# Patient Record
Sex: Female | Born: 1975 | Race: White | Hispanic: No | Marital: Married | Smoking: Current every day smoker
Health system: Southern US, Community
[De-identification: ages and names within clinical notes are randomized; demographics above are authoritative.]

## PROBLEM LIST (undated history)

## (undated) DIAGNOSIS — G5603 Carpal tunnel syndrome, bilateral upper limbs: Secondary | ICD-10-CM

## (undated) DIAGNOSIS — K219 Gastro-esophageal reflux disease without esophagitis: Secondary | ICD-10-CM

## (undated) DIAGNOSIS — M1990 Unspecified osteoarthritis, unspecified site: Secondary | ICD-10-CM

## (undated) DIAGNOSIS — F419 Anxiety disorder, unspecified: Secondary | ICD-10-CM

## (undated) HISTORY — PX: MOUTH SURGERY: SHX715

## (undated) HISTORY — DX: Anxiety disorder, unspecified: F41.9

## (undated) HISTORY — DX: Gastro-esophageal reflux disease without esophagitis: K21.9

## (undated) HISTORY — DX: Unspecified osteoarthritis, unspecified site: M19.90

## (undated) HISTORY — DX: Carpal tunnel syndrome, bilateral upper limbs: G56.03

## (undated) HISTORY — PX: TUBAL LIGATION: SHX77

---

## 2010-06-13 ENCOUNTER — Emergency Department (HOSPITAL_COMMUNITY)
Admission: EM | Admit: 2010-06-13 | Discharge: 2010-06-13 | Disposition: A | Discharge status: Home / Self Care | Payer: Self-pay | Attending: Emergency Medicine | Admitting: Emergency Medicine

## 2010-06-13 DIAGNOSIS — L738 Other specified follicular disorders: Secondary | ICD-10-CM | POA: Insufficient documentation

## 2010-06-13 DIAGNOSIS — F17200 Nicotine dependence, unspecified, uncomplicated: Secondary | ICD-10-CM | POA: Insufficient documentation

## 2012-05-15 ENCOUNTER — Other Ambulatory Visit (HOSPITAL_COMMUNITY): Payer: Self-pay | Admitting: Sports Medicine

## 2012-05-15 DIAGNOSIS — M25562 Pain in left knee: Secondary | ICD-10-CM

## 2012-05-20 ENCOUNTER — Ambulatory Visit (HOSPITAL_COMMUNITY): Payer: No Typology Code available for payment source

## 2012-05-27 ENCOUNTER — Ambulatory Visit (HOSPITAL_COMMUNITY)
Admission: RE | Admit: 2012-05-27 | Discharge: 2012-05-27 | Disposition: A | Discharge status: Home / Self Care | Payer: Self-pay | Source: Ambulatory Visit | Attending: Sports Medicine | Admitting: Sports Medicine

## 2012-05-27 ENCOUNTER — Encounter (HOSPITAL_COMMUNITY): Payer: Self-pay

## 2012-05-27 DIAGNOSIS — R609 Edema, unspecified: Secondary | ICD-10-CM | POA: Insufficient documentation

## 2012-05-27 DIAGNOSIS — M25469 Effusion, unspecified knee: Secondary | ICD-10-CM | POA: Insufficient documentation

## 2012-05-27 DIAGNOSIS — S8000XA Contusion of unspecified knee, initial encounter: Secondary | ICD-10-CM | POA: Insufficient documentation

## 2012-05-27 DIAGNOSIS — M25562 Pain in left knee: Secondary | ICD-10-CM

## 2013-06-12 ENCOUNTER — Emergency Department (HOSPITAL_COMMUNITY)
Admission: EM | Admit: 2013-06-12 | Discharge: 2013-06-12 | Disposition: A | Discharge status: Home / Self Care | Payer: BC Managed Care – PPO | Attending: Emergency Medicine | Admitting: Emergency Medicine

## 2013-06-12 ENCOUNTER — Encounter (HOSPITAL_COMMUNITY): Payer: Self-pay | Admitting: Emergency Medicine

## 2013-06-12 DIAGNOSIS — Y92009 Unspecified place in unspecified non-institutional (private) residence as the place of occurrence of the external cause: Secondary | ICD-10-CM | POA: Insufficient documentation

## 2013-06-12 DIAGNOSIS — S6990XA Unspecified injury of unspecified wrist, hand and finger(s), initial encounter: Secondary | ICD-10-CM | POA: Insufficient documentation

## 2013-06-12 DIAGNOSIS — Z880 Allergy status to penicillin: Secondary | ICD-10-CM | POA: Insufficient documentation

## 2013-06-12 DIAGNOSIS — W010XXA Fall on same level from slipping, tripping and stumbling without subsequent striking against object, initial encounter: Secondary | ICD-10-CM | POA: Insufficient documentation

## 2013-06-12 DIAGNOSIS — M7989 Other specified soft tissue disorders: Secondary | ICD-10-CM | POA: Insufficient documentation

## 2013-06-12 DIAGNOSIS — S6980XA Other specified injuries of unspecified wrist, hand and finger(s), initial encounter: Secondary | ICD-10-CM | POA: Insufficient documentation

## 2013-06-12 DIAGNOSIS — F17200 Nicotine dependence, unspecified, uncomplicated: Secondary | ICD-10-CM | POA: Insufficient documentation

## 2013-06-12 DIAGNOSIS — X12XXXA Contact with other hot fluids, initial encounter: Secondary | ICD-10-CM | POA: Insufficient documentation

## 2013-06-12 DIAGNOSIS — S46909A Unspecified injury of unspecified muscle, fascia and tendon at shoulder and upper arm level, unspecified arm, initial encounter: Secondary | ICD-10-CM | POA: Insufficient documentation

## 2013-06-12 DIAGNOSIS — T22012A Burn of unspecified degree of left forearm, initial encounter: Secondary | ICD-10-CM

## 2013-06-12 DIAGNOSIS — Y9389 Activity, other specified: Secondary | ICD-10-CM | POA: Insufficient documentation

## 2013-06-12 DIAGNOSIS — T22119A Burn of first degree of unspecified forearm, initial encounter: Secondary | ICD-10-CM | POA: Insufficient documentation

## 2013-06-12 DIAGNOSIS — S4980XA Other specified injuries of shoulder and upper arm, unspecified arm, initial encounter: Secondary | ICD-10-CM | POA: Insufficient documentation

## 2013-06-12 DIAGNOSIS — X131XXA Other contact with steam and other hot vapors, initial encounter: Secondary | ICD-10-CM

## 2013-06-12 MED ORDER — HYDROCODONE-ACETAMINOPHEN 5-325 MG PO TABS
1.0000 | ORAL_TABLET | Freq: Once | ORAL | Status: AC
  Administered 2013-06-12: 1 via ORAL
  Filled 2013-06-12: qty 1

## 2013-06-12 MED ORDER — HYDROCODONE-ACETAMINOPHEN 5-325 MG PO TABS
ORAL_TABLET | ORAL | Status: DC
Start: 2013-06-12 — End: 2013-06-13

## 2013-06-12 MED ORDER — IBUPROFEN 800 MG PO TABS: 800.0000 mg | ORAL_TABLET | Freq: Three times a day (TID) | ORAL | Status: DC

## 2013-06-12 MED ORDER — SILVER SULFADIAZINE 1 % EX CREA
TOPICAL_CREAM | Freq: Once | CUTANEOUS | Status: AC
  Administered 2013-06-12: 17:00:00 via TOPICAL
  Filled 2013-06-12: qty 50

## 2013-06-12 NOTE — Discharge Instructions (Signed)
Burn Care °Burns hurt your skin. When your skin is hurt, it is easier to get an infection. Follow your doctor's directions to help prevent an infection. °HOME CARE °· Wash your hands well before you change your bandage. °· Change your bandage as often as told by your doctor. °· Remove the old bandage. If the bandage sticks, soak it off with cool, clean water. °· Gently clean the burn with mild soap and water. °· Pat the burn dry with a clean, dry cloth. °· Put a thin layer of medicated cream on the burn. °· Put a clean bandage on as told by your doctor. °· Keep the bandage clean and dry. °· Raise (elevate) the burn for the first 24 hours. After that, follow your doctor's directions. °· Only take medicine as told by your doctor. °GET HELP RIGHT AWAY IF:  °· You have too much pain. °· The skin near the burn is red, tender, puffy (swollen), or has red streaks. °· The burn area has yellowish white fluid (pus) or a bad smell coming from it. °· You have a fever. °MAKE SURE YOU:  °· Understand these instructions. °· Will watch your condition. °· Will get help right away if you are not doing well or get worse. °Document Released: 11/29/2007 Document Revised: 05/14/2011 Document Reviewed: 07/12/2010 °ExitCare® Patient Information ©2014 [employer]. ° °
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
## 2013-06-13 ENCOUNTER — Emergency Department (HOSPITAL_COMMUNITY): Payer: BC Managed Care – PPO

## 2013-06-13 ENCOUNTER — Emergency Department (HOSPITAL_COMMUNITY)
Admission: EM | Admit: 2013-06-13 | Discharge: 2013-06-13 | Disposition: A | Discharge status: Home / Self Care | Payer: BC Managed Care – PPO | Attending: Emergency Medicine | Admitting: Emergency Medicine

## 2013-06-13 ENCOUNTER — Encounter (HOSPITAL_COMMUNITY): Payer: Self-pay | Admitting: Emergency Medicine

## 2013-06-13 DIAGNOSIS — Z09 Encounter for follow-up examination after completed treatment for conditions other than malignant neoplasm: Secondary | ICD-10-CM

## 2013-06-13 DIAGNOSIS — Z880 Allergy status to penicillin: Secondary | ICD-10-CM | POA: Insufficient documentation

## 2013-06-13 DIAGNOSIS — F17200 Nicotine dependence, unspecified, uncomplicated: Secondary | ICD-10-CM | POA: Insufficient documentation

## 2013-06-13 DIAGNOSIS — Z4800 Encounter for change or removal of nonsurgical wound dressing: Secondary | ICD-10-CM | POA: Insufficient documentation

## 2013-06-13 MED ORDER — OXYCODONE-ACETAMINOPHEN 5-325 MG PO TABS: 1.0000 | ORAL_TABLET | ORAL | Status: DC | PRN

## 2013-06-13 NOTE — Discharge Instructions (Signed)
Your xrays are normal tonight.  Continue using ice and elevation for your pain and swelling.  You may use the pain medicine prescribed in place of the hydrocodone for improved pain relief.  This medicine will make you drowsy - do not drive within 4 hours of taking this medicine.  Continue applying the silvadene cream to your burn twice daily for one week, then only if you continue to have pain at the burn site.
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
## 2013-06-14 NOTE — ED Provider Notes (Signed)
Medical screening examination/treatment/procedure(s) were performed by non-physician practitioner and as supervising physician I was immediately available for consultation/collaboration.   EKG Interpretation None        Avyana Puffenbarger L Mirra Basilio, MD 06/14/13 1506 

## 2013-06-16 ENCOUNTER — Encounter: Payer: Self-pay | Admitting: General Practice

## 2013-06-16 ENCOUNTER — Ambulatory Visit (INDEPENDENT_AMBULATORY_CARE_PROVIDER_SITE_OTHER): Payer: BC Managed Care – PPO | Admitting: General Practice

## 2013-06-16 VITALS — BP 112/75 | HR 80 | Temp 98.5°F | Ht 61.0 in | Wt 192.4 lb

## 2013-06-16 DIAGNOSIS — R209 Unspecified disturbances of skin sensation: Secondary | ICD-10-CM

## 2013-06-16 DIAGNOSIS — R200 Anesthesia of skin: Secondary | ICD-10-CM

## 2013-06-16 DIAGNOSIS — R202 Paresthesia of skin: Principal | ICD-10-CM

## 2013-06-16 NOTE — Progress Notes (Signed)
   Subjective:    Patient ID: Christy Howell, female    DOB: Feb 06, 1976, 38 y.o.   MRN: [id number]  HPI Patient presents today with complaints of right hand numbness. Onset at least 1 year ago and gradually worsened. Worse upon waking in am. Numbness radiates from right hand to right lower arm. Also worsens with brushing teeth, blow drying hair, working on computer, and other activities where arm is elevated. Reports similar symptoms in left hand. Denies known injury. History of working at jobs that required repetitive motion for several years. Reports taking otc B 12 vitamins.     Review of Systems  Constitutional: Negative for fever and chills.  Respiratory: Negative for chest tightness and shortness of breath.   Cardiovascular: Negative for chest pain and palpitations.  Neurological: Positive for numbness. Negative for dizziness, weakness and headaches.       Numbness to right hand/lower arm.       Objective:   Physical Exam  Constitutional: She is oriented to person, place, and time. She appears well-developed and well-nourished.  Cardiovascular: Normal rate, regular rhythm and normal heart sounds.   Pulmonary/Chest: Effort normal and breath sounds normal. No respiratory distress. She exhibits no tenderness.  Neurological: She is alert and oriented to person, place, and time.  Skin: Skin is warm and dry.  Psychiatric: She has a normal mood and affect.          Assessment & Plan:  1. Numbness and tingling in right hand - Ambulatory referral to Neurology -RTO prn -may seek emergency treatment -Patient verbalized understanding Coralie KeensMae E. Glendale Wherry, FNP-C

## 2013-06-16 NOTE — Patient Instructions (Signed)
Paresthesia °Paresthesia is an abnormal burning or prickling sensation. This sensation is generally felt in the hands, arms, legs, or feet. However, it may occur in any part of the body. It is usually not painful. The feeling may be described as: °· Tingling or numbness. °· "Pins and needles." °· Skin crawling. °· Buzzing. °· Limbs "falling asleep." °· Itching. °Most people experience temporary (transient) paresthesia at some time in their lives. °CAUSES  °Paresthesia may occur when you breathe too quickly (hyperventilation). It can also occur without any apparent cause. Commonly, paresthesia occurs when pressure is placed on a nerve. The feeling quickly goes away once the pressure is removed. For some people, however, paresthesia is a long-lasting (chronic) condition caused by an underlying disorder. The underlying disorder may be: °· A traumatic, direct injury to nerves. Examples include a: °· Broken (fractured) neck. °· Fractured skull. °· A disorder affecting the brain and spinal cord (central nervous system). Examples include: °· Transverse myelitis. °· Encephalitis. °· Transient ischemic attack. °· Multiple sclerosis. °· Stroke. °· Tumor or blood vessel problems, such as an arteriovenous malformation pressing against the brain or spinal cord. °· A condition that damages the peripheral nerves (peripheral neuropathy). Peripheral nerves are not part of the brain and spinal cord. These conditions include: °· Diabetes. °· Peripheral vascular disease. °· Nerve entrapment syndromes, such as carpal tunnel syndrome. °· Shingles. °· Hypothyroidism. °· Vitamin B12 deficiencies. °· Alcoholism. °· Heavy metal poisoning (lead, arsenic). °· Rheumatoid arthritis. °· Systemic lupus erythematosus. °DIAGNOSIS  °Your caregiver will attempt to find the underlying cause of your paresthesia. Your caregiver may: °· Take your medical history. °· Perform a physical exam. °· Order various lab tests. °· Order imaging tests. °TREATMENT    °Treatment for paresthesia depends on the underlying cause. °HOME CARE INSTRUCTIONS °· Avoid drinking alcohol. °· You may consider massage or acupuncture to help relieve your symptoms. °· Keep all follow-up appointments as directed by your caregiver. °SEEK IMMEDIATE MEDICAL CARE IF:  °· You feel weak. °· You have trouble walking or moving. °· You have problems with speech or vision. °· You feel confused. °· You cannot control your bladder or bowel movements. °· You feel numbness after an injury. °· You faint. °· Your burning or prickling feeling gets worse when walking. °· You have pain, cramps, or dizziness. °· You develop a rash. °MAKE SURE YOU: °· Understand these instructions. °· Will watch your condition. °· Will get help right away if you are not doing well or get worse. °Document Released: 02/09/2002 Document Revised: 05/14/2011 Document Reviewed: 11/10/2010 °ExitCare® Patient Information ©2014 [employer]. ° °

## 2013-06-18 NOTE — ED Provider Notes (Signed)
Medical screening examination/treatment/procedure(s) were performed by non-physician practitioner and as supervising physician I was immediately available for consultation/collaboration.   EKG Interpretation None       Arelly Whittenberg, MD 06/18/13 1405 

## 2013-06-29 ENCOUNTER — Encounter: Payer: Self-pay | Admitting: Neurology

## 2013-06-29 ENCOUNTER — Ambulatory Visit (INDEPENDENT_AMBULATORY_CARE_PROVIDER_SITE_OTHER): Payer: BC Managed Care – PPO | Admitting: Neurology

## 2013-06-29 VITALS — BP 106/69 | HR 71 | Ht 62.0 in | Wt 191.0 lb

## 2013-06-29 DIAGNOSIS — G5601 Carpal tunnel syndrome, right upper limb: Secondary | ICD-10-CM | POA: Insufficient documentation

## 2013-06-29 DIAGNOSIS — G5600 Carpal tunnel syndrome, unspecified upper limb: Secondary | ICD-10-CM

## 2013-06-29 NOTE — Patient Instructions (Signed)
Overall you are doing fairly well but I do want to suggest a few things today:   As far as diagnostic testing:  1)Please schedule a nerve conduction test when you check out  Please try extension wrist splints, [facility]. Try initially just wearing them at night  Please call us with any interim questions, concerns, problems, updates or refill requests.   My clinical assistant and will answer any of your questions and relay your messages to me and also relay most of my messages to you.   Our phone number is [phone]. We also have an after hours call service for urgent matters and there is a physician on-call for urgent questions. For any emergencies you know to call 911 or go to the nearest emergency room

## 2013-06-29 NOTE — Progress Notes (Signed)
[facility]    Provider:  Dr Hosie PoissonSumner Referring Provider: Ernestina PennaMoore, Donald W, MD Primary Care Physician:  Rudi HeapMOORE, DONALD, MD  CC:  Bilateral hand paresthesias  HPI:  Gabriel RainwaterJuni A Howell is a 38 y.o. female here as a referral from Dr. Christell ConstantMoore for bilateral hand paresthesias right > left. Symptoms started around 1 year ago. Initially noted a lack of sensation and pins and needles sensation, has gotten progressively worse, is waking her up at night, gets better with shaking it. Will get very severe when driving or blow drying her hair. Notes some subjective weakness in her hands. Involves the thumb and first 3 fingers. Will sometimes have an aching pain in her right forearm. No cervical neck pain, no lower extremity weakness or sensory changes. Has a history of repetitive use of her hands.   Review of Systems: Out of a complete 14 system review, the patient complains of only the following symptoms, and all other reviewed systems are negative. + numbness  History   Social History  . Marital Status: Married    Spouse Name: N/A    Number of Children: N/A  . Years of Education: N/A   Occupational History  . Not on file.   Social History Main Topics  . Smoking status: Current Every Day Smoker  . Smokeless tobacco: Not on file  . Alcohol Use: Yes  . Drug Use: No  . Sexual Activity: Yes    Birth Control/ Protection: None, Surgical   Other Topics Concern  . Not on file   Social History Narrative   Married, 3 children   Right handed   12 th grade   4-5 16 oz diet sodas daily    No family history on file.  No past medical history on file.  Past Surgical History  Procedure Laterality Date  . Cesarean section    . Tubal ligation      Current Outpatient Prescriptions  Medication Sig Dispense Refill  . Aspirin-Acetaminophen-Caffeine (GOODY HEADACHE PO) Take 1 packet by mouth daily as needed (for pain).      . naproxen sodium (ALEVE) 220 MG tablet Take 220-440 mg by  mouth daily as needed (for pain).       No current facility-administered medications for this visit.    Allergies as of 06/29/2013 - Review Complete 06/29/2013  Allergen Reaction Noted  . Strawberry Hives 06/12/2013  . Penicillins Rash 06/12/2013    Vitals: BP 106/69  Pulse 71  Ht 5\' 2"  (1.575 m)  Wt 191 lb (86.637 kg)  BMI 34.93 kg/m2  LMP 05/20/2013 Last Weight:  Wt Readings from Last 1 Encounters:  06/29/13 191 lb (86.637 kg)   Last Height:   Ht Readings from Last 1 Encounters:  06/29/13 5\' 2"  (1.575 m)     Physical exam: Exam: Gen: NAD, conversant Eyes: anicteric sclerae, moist conjunctivae HENT: Atraumatic, oropharynx clear Neck: Trachea midline; supple,  Lungs: CTA, no wheezing, rales, rhonic                          CV: RRR, no MRG Abdomen: Soft, non-tender;  Extremities: No peripheral edema  Skin: Normal temperature, no rash,  Psych: Appropriate affect, pleasant  Neuro: MS: AA&Ox3, appropriately interactive, normal affect   Speech: fluent w/o paraphasic error  Memory: good recent and remote recall  CN: PERRL, EOMI no nystagmus, no ptosis, sensation intact to LT V1-V3 bilat, face symmetric, no weakness, hearing grossly intact, palate elevates symmetrically, shoulder shrug  5/5 bilat,  tongue protrudes midline, no fasiculations noted.  Motor: normal bulk and tone Strength: 5/5  In all extremities  Coord: rapid alternating and point-to-point (FNF, HTS) movements intact.  Reflexes: symmetrical, bilat downgoing toes  Sens: decreased LT and PP in median distribution of right hand, normal sensation left hand and bilateral LE  Negative Tinels and Phalens sign  Gait: posture, stance, stride and arm-swing normal. Tandem gait intact. Able to walk on heels and toes. Romberg absent.   Assessment:  After physical and neurologic examination, review of laboratory studies, imaging, neurophysiology testing and pre-existing records, assessment will be reviewed  on the problem list.  Plan:  Treatment plan and additional workup will be reviewed under Problem List.  1)CTS  37y/o woman presenting for initial evaluation of bilateral hand paresthesias. 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 Will order EMG/NCS. Will try extension wrist splints. Can consider cortisol injection in the future and/or referral to hand surgeon.   Elspeth ChoPeter Reyhan Moronta, DO  [facility] [address] [place], [place] [address]  Phone [phone] Fax [phone]

## 2013-07-10 ENCOUNTER — Encounter (INDEPENDENT_AMBULATORY_CARE_PROVIDER_SITE_OTHER): Payer: Self-pay

## 2013-07-10 ENCOUNTER — Ambulatory Visit (INDEPENDENT_AMBULATORY_CARE_PROVIDER_SITE_OTHER): Payer: BC Managed Care – PPO | Admitting: Neurology

## 2013-07-10 DIAGNOSIS — G5600 Carpal tunnel syndrome, unspecified upper limb: Secondary | ICD-10-CM

## 2013-07-10 DIAGNOSIS — Z0289 Encounter for other administrative examinations: Secondary | ICD-10-CM

## 2013-07-10 NOTE — Procedures (Signed)
     HISTORY:  [name] is a 38 year old patient with a history of numbness primarily in the right hand without neck discomfort or pain down the right arm. More recently, there has been some similar symptoms just at nighttime involving the left hand. The patient is being evaluated for possible carpal tunnel syndrome.  NERVE CONDUCTION STUDIES:  Nerve conduction studies were performed on both upper extremities. The distal motor latencies for the median nerves were prolonged on the right, normal on the left, and with normal motor amplitudes for these nerves bilaterally. The distal motor latencies and motor amplitudes for the ulnar nerves were normal bilaterally. The F wave latencies and nerve conduction velocities for the median and ulnar nerves were normal bilaterally. The sensory latencies for the median nerves were prolonged on the right, normal on the left, with normal sensory latencies for the ulnar nerves bilaterally.  EMG STUDIES:  EMG study was performed on the right upper extremity:  The first dorsal interosseous muscle reveals 2 to 4 K units with full recruitment. No fibrillations or positive waves were noted. The abductor pollicis brevis muscle reveals 2 to 5 K units with full recruitment. No fibrillations or positive waves were noted. The extensor indicis proprius muscle reveals 1 to 3 K units with full recruitment. No fibrillations or positive waves were noted. The pronator teres muscle reveals 2 to 3 K units with full recruitment. No fibrillations or positive waves were noted. The biceps muscle reveals 1 to 2 K units with full recruitment. No fibrillations or positive waves were noted. The triceps muscle reveals 2 to 4 K units with full recruitment. No fibrillations or positive waves were noted. The anterior deltoid muscle reveals 2 to 3 K units with full recruitment. No fibrillations or positive waves were noted. The cervical paraspinal muscles were tested at 2 levels. No  abnormalities of insertional activity were seen at either level tested. There was good relaxation.  A limited EMG study was performed on the left upper extremity:  The first dorsal interosseous muscle reveals 2 to 4 K units with full recruitment. No fibrillations or positive waves were noted. The abductor pollicis brevis muscle reveals 2 to 4 K units with full recruitment. No fibrillations or positive waves were noted. The extensor indicis proprius muscle reveals 1 to 3 K units with full recruitment. No fibrillations or positive waves were noted.    IMPRESSION:  Nerve conduction studies done on both upper extremities revealed evidence of a mild right carpal tunnel syndrome. There is no evidence of carpal tunnel syndrome on the left. EMG evaluation of the right upper extremity is unremarkable, without evidence of an overlying cervical radiculopathy. A limited EMG study of the left upper extremity was unremarkable.  Marlan Palau. Keith Willis MD 07/10/2013 1:50 PM  [facility] [address] [place], [place] [id number]  Phone [phone] Fax [phone]

## 2013-07-13 ENCOUNTER — Telehealth: Payer: Self-pay | Admitting: *Deleted

## 2013-07-14 NOTE — Telephone Encounter (Signed)
Called pt concerning her EMG results. Pt verbalized understanding.

## 2014-02-23 ENCOUNTER — Encounter: Payer: Self-pay | Admitting: Nurse Practitioner

## 2014-02-23 ENCOUNTER — Ambulatory Visit (INDEPENDENT_AMBULATORY_CARE_PROVIDER_SITE_OTHER): Payer: BC Managed Care – PPO | Admitting: Nurse Practitioner

## 2014-02-23 VITALS — BP 125/80 | HR 72 | Temp 97.6°F | Ht 62.0 in | Wt 189.0 lb

## 2014-02-23 DIAGNOSIS — J0100 Acute maxillary sinusitis, unspecified: Secondary | ICD-10-CM

## 2014-02-23 MED ORDER — AZITHROMYCIN 250 MG PO TABS: ORAL_TABLET | ORAL | Status: DC

## 2014-02-23 MED ORDER — BENZONATATE 100 MG PO CAPS: 100.0000 mg | ORAL_CAPSULE | Freq: Two times a day (BID) | ORAL | Status: DC | PRN

## 2014-02-23 NOTE — Patient Instructions (Signed)

## 2014-02-23 NOTE — Progress Notes (Signed)
Subjective:    Patient ID: Christy RainwaterJuni A Schuknecht, female    DOB: 1975-12-17, 38 y.o.   MRN: [id number]  HPI Patient in today with c/o cough and congestion that started Saturday night- otc meds no help.    Review of Systems  Constitutional: Negative for fever and chills.  HENT: Positive for congestion, rhinorrhea, sinus pressure and voice change. Negative for sore throat and trouble swallowing.   Respiratory: Positive for cough.   Cardiovascular: Negative.   Gastrointestinal: Negative.   Genitourinary: Negative.   Skin: Negative.   Neurological: Negative.   Psychiatric/Behavioral: Negative.   All other systems reviewed and are negative.      Objective:   Physical Exam  Constitutional: She is oriented to person, place, and time. She appears well-developed and well-nourished. No distress.  HENT:  Right Ear: Hearing, tympanic membrane, external ear and ear canal normal.  Left Ear: Hearing, tympanic membrane, external ear and ear canal normal.  Nose: Mucosal edema and rhinorrhea present. Right sinus exhibits maxillary sinus tenderness. Right sinus exhibits no frontal sinus tenderness. Left sinus exhibits maxillary sinus tenderness. Left sinus exhibits no frontal sinus tenderness.  Mouth/Throat: Uvula is midline and mucous membranes are normal.  Eyes: Pupils are equal, round, and reactive to light.  Neck: Normal range of motion. Neck supple.  Cardiovascular: Normal rate, regular rhythm and normal heart sounds.   Pulmonary/Chest: Effort normal and breath sounds normal. No respiratory distress. She has no wheezes. She has no rales.  Dry cough  Abdominal: Soft. Bowel sounds are normal.  Lymphadenopathy:    She has no cervical adenopathy.  Neurological: She is alert and oriented to person, place, and time.  Skin: Skin is warm and dry.  Psychiatric: She has a normal mood and affect. Her behavior is normal. Judgment and thought content normal.  BP 125/80 mmHg  Pulse 72  Temp(Src) 97.6 F  (36.4 C) (Oral)  Ht 5\' 2"  (1.575 m)  Wt 189 lb (85.73 kg)  BMI 34.56 kg/m2         Assessment & Plan:   1. Acute maxillary sinusitis, recurrence not specified    Meds ordered this encounter  Medications  . azithromycin (ZITHROMAX Z-PAK) 250 MG tablet    Sig: As directed    Dispense:  6 each    Refill:  0    Order Specific Question:  Supervising Provider    Answer:  Ernestina PennaMOORE, DONALD W [[id number]]  . benzonatate (TESSALON) 100 MG capsule    Sig: Take 1 capsule (100 mg total) by mouth 2 (two) times daily as needed for cough.    Dispense:  20 capsule    Refill:  0    Order Specific Question:  Supervising Provider    Answer:  Ernestina PennaMOORE, DONALD W [[id number]]   1. Take meds as prescribed 2. Use a cool mist humidifier especially during the winter months and when heat has been humid. 3. Use saline nose sprays frequently 4. Saline irrigations of the nose can be very helpful if done frequently.  * 4X daily for 1 week*  * Use of a nettie pot can be helpful with this. Follow directions with this* 5. Drink plenty of fluids 6. Keep thermostat turn down low 7.For any cough or congestion  Use plain Mucinex- regular strength or max strength is fine   * Children- consult with Pharmacist for dosing 8. For fever or aces or pains- take tylenol or ibuprofen appropriate for age and weight.  * for fevers greater than 101 orally  you may alternate ibuprofen and tylenol every  3 hours.   Mary-Margaret Hassell Done, FNP

## 2014-03-01 ENCOUNTER — Telehealth: Payer: Self-pay | Admitting: Nurse Practitioner

## 2014-03-01 NOTE — Telephone Encounter (Signed)
No fever but some soreness in throat.  Advised to get throat lozenges and other OTC for help.  Z-pack stays in your system for 10 days.  If begins a fever , call us to follow up.

## 2014-03-01 NOTE — Telephone Encounter (Signed)
If not running a fever does not need another antibiotic
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
## 2015-07-04 ENCOUNTER — Emergency Department (HOSPITAL_COMMUNITY)
Admission: EM | Admit: 2015-07-04 | Discharge: 2015-07-05 | Disposition: A | Discharge status: Home / Self Care | Payer: No Typology Code available for payment source | Attending: Emergency Medicine | Admitting: Emergency Medicine

## 2015-07-04 ENCOUNTER — Encounter (HOSPITAL_COMMUNITY): Payer: Self-pay | Admitting: Emergency Medicine

## 2015-07-04 ENCOUNTER — Emergency Department (HOSPITAL_COMMUNITY): Payer: Self-pay

## 2015-07-04 DIAGNOSIS — R109 Unspecified abdominal pain: Secondary | ICD-10-CM

## 2015-07-04 DIAGNOSIS — R1031 Right lower quadrant pain: Secondary | ICD-10-CM | POA: Insufficient documentation

## 2015-07-04 DIAGNOSIS — Z7982 Long term (current) use of aspirin: Secondary | ICD-10-CM | POA: Insufficient documentation

## 2015-07-04 DIAGNOSIS — Z79899 Other long term (current) drug therapy: Secondary | ICD-10-CM | POA: Insufficient documentation

## 2015-07-04 DIAGNOSIS — F17210 Nicotine dependence, cigarettes, uncomplicated: Secondary | ICD-10-CM | POA: Insufficient documentation

## 2015-07-04 LAB — URINE MICROSCOPIC-ADD ON: RBC / HPF: NONE SEEN RBC/hpf (ref 0–5)

## 2015-07-04 LAB — CBC WITH DIFFERENTIAL/PLATELET
BASOS ABS: 0 10*3/uL (ref 0.0–0.1)
Basophils Relative: 0 %
EOS PCT: 0 %
Eosinophils Absolute: 0 10*3/uL (ref 0.0–0.7)
HCT: 41.2 % (ref 36.0–46.0)
Hemoglobin: 14.1 g/dL (ref 12.0–15.0)
Lymphocytes Relative: 18 %
Lymphs Abs: 3.5 10*3/uL (ref 0.7–4.0)
MCH: 30.5 pg (ref 26.0–34.0)
MCHC: 34.2 g/dL (ref 30.0–36.0)
MCV: 89.2 fL (ref 78.0–100.0)
MONO ABS: 1.3 10*3/uL — AB (ref 0.1–1.0)
Monocytes Relative: 7 %
NEUTROS ABS: 14.7 10*3/uL — AB (ref 1.7–7.7)
Neutrophils Relative %: 75 %
PLATELETS: 256 10*3/uL (ref 150–400)
RBC: 4.62 MIL/uL (ref 3.87–5.11)
RDW: 12.1 % (ref 11.5–15.5)
WBC: 19.6 10*3/uL — AB (ref 4.0–10.5)

## 2015-07-04 LAB — URINALYSIS, ROUTINE W REFLEX MICROSCOPIC
Bilirubin Urine: NEGATIVE
GLUCOSE, UA: NEGATIVE mg/dL
HGB URINE DIPSTICK: NEGATIVE
Ketones, ur: NEGATIVE mg/dL
Nitrite: NEGATIVE
Protein, ur: NEGATIVE mg/dL
SPECIFIC GRAVITY, URINE: 1.005 (ref 1.005–1.030)
pH: 5.5 (ref 5.0–8.0)

## 2015-07-04 LAB — COMPREHENSIVE METABOLIC PANEL
ALBUMIN: 4.1 g/dL (ref 3.5–5.0)
ALT: 63 U/L — ABNORMAL HIGH (ref 14–54)
AST: 22 U/L (ref 15–41)
Alkaline Phosphatase: 46 U/L (ref 38–126)
Anion gap: 9 (ref 5–15)
BUN: 12 mg/dL (ref 6–20)
CHLORIDE: 102 mmol/L (ref 101–111)
CO2: 27 mmol/L (ref 22–32)
Calcium: 9 mg/dL (ref 8.9–10.3)
Creatinine, Ser: 0.67 mg/dL (ref 0.44–1.00)
GFR calc Af Amer: >60 mL/min (ref >60)
GFR calc non Af Amer: >60 mL/min (ref >60)
GLUCOSE: 104 mg/dL — AB (ref 65–99)
POTASSIUM: 3.9 mmol/L (ref 3.5–5.1)
Sodium: 138 mmol/L (ref 135–145)
Total Bilirubin: 0.5 mg/dL (ref 0.3–1.2)
Total Protein: 7.4 g/dL (ref 6.5–8.1)

## 2015-07-04 MED ORDER — ONDANSETRON HCL 4 MG/2ML IJ SOLN
4.0000 mg | Freq: Once | INTRAMUSCULAR | Status: AC
  Administered 2015-07-05: 4 mg via INTRAVENOUS
  Filled 2015-07-04: qty 2

## 2015-07-04 MED ORDER — FENTANYL CITRATE (PF) 100 MCG/2ML IJ SOLN
50.0000 ug | Freq: Once | INTRAMUSCULAR | Status: AC
  Administered 2015-07-05: 50 ug via INTRAVENOUS
  Filled 2015-07-04: qty 2

## 2015-07-04 NOTE — ED Notes (Addendum)
Patient complaining of right flank pain x 1 week. Also complaining of urinary frequency "but I'm not able to go."

## 2015-07-04 NOTE — ED Provider Notes (Signed)
CSN: [id number]     Arrival date & time 07/04/15  1936 History  By signing my name below, I, Christy Howell, attest that this documentation has been prepared under the direction and in the presence of Christy Albe, MD at 2342 PM. Electronically Signed: Linus Howell, ED Scribe. 07/05/2015. 11:52 PM.   Chief Complaint  Patient presents with  . Flank Pain   The history is provided by the patient. No language interpreter was used.   HPI Comments: Christy Howell is a 40 y.o. female who presents to the Emergency Department complaining of achy right flank pain that began 1 week ago. She reports her pain is sharp and shooting at times. Pt also reports urinary frequency. Pt states she woke up with this pain last Monday, 7 days ago. She states by Thursday 4 days ago, her pain had worsened. She was seen at an Urgent care who found blood in urine as per pt.She was told she either had a kidney infection or a pulled muscle in her back. She was treated with prednisone. At 4:30 AM this morning, her pain had peaked in severity and is worse with positional changes. Pt was hospitalized previously for back pain when she was diagnosed with pyelonephritis. She states her current pain is different than the pain she had previously. She has not tried any OTC medications for pain. Pt denies any excessive dairy use however consumes caffeine heavily. Pt denies any fevers, chills, N/V/D, dysuria, hematuria, or any other symptoms at this time. She states she finds it hard to sit still and wants to move around to try to get the pain to ease up. Pt smokes 0.5 pack of cigarettes a day. 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   Pt is followed by NP Paulene Floor at [facility]   History reviewed. No pertinent past medical history. Past Surgical History  Procedure Laterality Date  . Cesarean section    . Tubal ligation     History reviewed. No pertinent family history. Social History  Substance Use  Topics  . Smoking status: Current Every Day Smoker  . Smokeless tobacco: None  . Alcohol Use: No  smokes 1/2 ppd employed  OB History    No data available     Review of Systems  Constitutional: Negative for fever and chills.  Gastrointestinal: Negative for nausea, vomiting and diarrhea.  Genitourinary: Positive for frequency and flank pain. Negative for dysuria and hematuria.  All other systems reviewed and are negative.  Allergies  Strawberry extract and Penicillins  Home Medications  Mirena  Prior to Admission medications   Medication Sig Start Date End Date Taking? Authorizing Provider  Aspirin-Acetaminophen-Caffeine (GOODY HEADACHE PO) Take 1 packet by mouth daily as needed (for pain).   Yes Historical Provider, MD  methocarbamol (ROBAXIN) 500 MG tablet Take 500 mg by mouth 2 (two) times daily as needed for muscle spasms.   Yes Historical Provider, MD  naproxen sodium (ALEVE) 220 MG tablet Take 220-440 mg by mouth daily as needed (for pain).   Yes Historical Provider, MD  predniSONE (DELTASONE) 20 MG tablet Take 20-60 mg by mouth See admin instructions. Take 3 tabs daily for 4 days, then take 2 tabs daily for 3 days, then take 1 tab daily for 3 days. Starting on 06/30/15   Yes Historical Provider, MD  cyclobenzaprine (FLEXERIL) 5 MG tablet Take 1 tablet (5 mg total) by mouth 3 (three) times daily as needed (muscle soreness). 07/05/15  Christy AlbeIva Louay Myrie, MD  naproxen (NAPROSYN) 500 MG tablet Take 1 po BID with food prn pain 07/05/15   Christy AlbeIva Christy Candelaria, MD   BP 117/55 mmHg  Pulse 80  Temp(Src) 98.1 F (36.7 C) (Oral)  Resp 18  Ht 5\' 1"  (1.549 m)  Wt 199 lb (90.266 kg)  BMI 37.62 kg/m2  SpO2 95%  Vital signs normal     Physical Exam  Constitutional: She is oriented to person, place, and time. She appears well-developed and well-nourished.  Non-toxic appearance. She does not appear ill. No distress.  HENT:  Head: Normocephalic and atraumatic.  Right Ear: External ear normal.  Left Ear:  External ear normal.  Nose: Nose normal. No mucosal edema or rhinorrhea.  Mouth/Throat: Oropharynx is clear and moist and mucous membranes are normal. No dental abscesses or uvula swelling.  Eyes: Conjunctivae and EOM are normal. Pupils are equal, round, and reactive to light.  Neck: Normal range of motion and full passive range of motion without pain. Neck supple.  Cardiovascular: Normal rate, regular rhythm and normal heart sounds.  Exam reveals no gallop and no friction rub.   No murmur heard. Pulmonary/Chest: Effort normal and breath sounds normal. No respiratory distress. She has no wheezes. She has no rhonchi. She has no rales. She exhibits no tenderness and no crepitus.  Abdominal: Soft. Normal appearance and bowel sounds are normal. She exhibits no distension. There is no rebound and no guarding.    Mild tenderness in the RLQ, area of pain noted  Genitourinary:  No CVA tenderness bilaterally.  Musculoskeletal: Normal range of motion. She exhibits no edema or tenderness.       Back:  Moves all extremities well. Area of pain noted  Neurological: She is alert and oriented to person, place, and time. She has normal strength. No cranial nerve deficit.  Skin: Skin is warm, dry and intact. No rash noted. No erythema. No pallor.  Psychiatric: Her speech is normal and behavior is normal. Her mood appears anxious.  Nursing note and vitals reviewed.   ED Course  Procedures   Medications  ondansetron (ZOFRAN) injection 4 mg (4 mg Intravenous Given 07/05/15 0001)  fentaNYL (SUBLIMAZE) injection 50 mcg (50 mcg Intravenous Given 07/05/15 0001)    DIAGNOSTIC STUDIES: Oxygen Saturation is 98% on room air, normal by my interpretation.    COORDINATION OF CARE: 11:44 PM Will order CT renal, blood work, urinalysis, and pregnancy screen. Discussed treatment plan with pt at bedside and pt agreed to plan.  02:45 patient was given the results of her CT scan. She states her pain is much better. She  refused anything else for pain.  Labs Review Results for orders placed or performed during the hospital encounter of 07/04/15  Urinalysis, Routine w reflex microscopic-may I&O cath if menses ([facility])  Result Value Ref Range   Color, Urine YELLOW YELLOW   APPearance CLEAR CLEAR   Specific Gravity, Urine 1.005 1.005 - 1.030   pH 5.5 5.0 - 8.0   Glucose, UA NEGATIVE NEGATIVE mg/dL   Hgb urine dipstick NEGATIVE NEGATIVE   Bilirubin Urine NEGATIVE NEGATIVE   Ketones, ur NEGATIVE NEGATIVE mg/dL   Protein, ur NEGATIVE NEGATIVE mg/dL   Nitrite NEGATIVE NEGATIVE   Leukocytes, UA TRACE (A) NEGATIVE  Urine microscopic-add on  Result Value Ref Range   Squamous Epithelial / LPF 0-5 (A) NONE SEEN   WBC, UA 0-5 0 - 5 WBC/hpf   RBC / HPF NONE SEEN 0 - 5 RBC/hpf  Bacteria, UA FEW (A) NONE SEEN  CBC with Differential/Platelet  Result Value Ref Range   WBC 19.6 (H) 4.0 - 10.5 K/uL   RBC 4.62 3.87 - 5.11 MIL/uL   Hemoglobin 14.1 12.0 - 15.0 g/dL   HCT 16.1 09.6 - 04.5 %   MCV 89.2 78.0 - 100.0 fL   MCH 30.5 26.0 - 34.0 pg   MCHC 34.2 30.0 - 36.0 g/dL   RDW 40.9 81.1 - 91.4 %   Platelets 256 150 - 400 K/uL   Neutrophils Relative % 75 %   Neutro Abs 14.7 (H) 1.7 - 7.7 K/uL   Lymphocytes Relative 18 %   Lymphs Abs 3.5 0.7 - 4.0 K/uL   Monocytes Relative 7 %   Monocytes Absolute 1.3 (H) 0.1 - 1.0 K/uL   Eosinophils Relative 0 %   Eosinophils Absolute 0.0 0.0 - 0.7 K/uL   Basophils Relative 0 %   Basophils Absolute 0.0 0.0 - 0.1 K/uL  Comprehensive metabolic panel  Result Value Ref Range   Sodium 138 135 - 145 mmol/L   Potassium 3.9 3.5 - 5.1 mmol/L   Chloride 102 101 - 111 mmol/L   CO2 27 22 - 32 mmol/L   Glucose, Bld 104 (H) 65 - 99 mg/dL   BUN 12 6 - 20 mg/dL   Creatinine, Ser 7.82 0.44 - 1.00 mg/dL   Calcium 9.0 8.9 - 95.6 mg/dL   Total Protein 7.4 6.5 - 8.1 g/dL   Albumin 4.1 3.5 - 5.0 g/dL   AST 22 15 - 41 U/L   ALT 63 (H) 14 - 54 U/L   Alkaline Phosphatase 46 38 - 126  U/L   Total Bilirubin 0.5 0.3 - 1.2 mg/dL   GFR calc non Af Amer >60 >60 mL/min   GFR calc Af Amer >60 >60 mL/min   Anion gap 9 5 - 15  Pregnancy, urine  Result Value Ref Range   Preg Test, Ur NEGATIVE NEGATIVE   Laboratory interpretation all normal except Minor elevation of SGOT, leukocytosis but has been on prednisone for 4 days     Imaging Review Ct Renal Stone Study  07/05/2015  CLINICAL DATA:  Right flank pain for 1 week.  Worsened today. EXAM: CT ABDOMEN AND PELVIS WITHOUT CONTRAST TECHNIQUE: Multidetector CT imaging of the abdomen and pelvis was performed following the standard protocol without IV contrast. COMPARISON:  None. FINDINGS: There is mild fatty infiltration of the liver without focal lesion. The gallbladder and bile ducts appear normal. There are unremarkable unenhanced appearances of the pancreas, spleen, adrenals and kidneys. There is no urinary calculus. There is no hydronephrosis or ureteral dilatation. Urinary bladder is unremarkable. Uterus is remarkable only for an IUD.  Ovaries are unremarkable. The appendix is normal. Stomach, small bowel and colon are unremarkable. The abdominal aorta is normal in caliber. There is no atherosclerotic calcification. There is no adenopathy in the abdomen or pelvis. No significant abnormalities evident in the lower chest. There is no significant musculoskeletal abnormality. Incidental findings include a small fat containing umbilical hernia. IMPRESSION: 1. No urinary calculus.  No acute urinary tract abnormality. 2. Fatty liver. 3. No acute inflammatory changes in the abdomen or pelvis. Electronically Signed   By: Ellery Plunk M.D.   On: 07/05/2015 00:50   I have personally reviewed and evaluated these images and lab results as part of my medical decision-making.    MDM  patient presents with right lateral flank pain and right-sided abdominal pain for the past week  without known injury. She has urinary frequency otherwise no other  symptoms such as nausea, vomiting, fever, coughing, [place]. CT scan does not show any reason for her pain. Patient was treated for muscular skeletal pain. She was advised to stop the prednisone.    Final diagnoses:  Acute right flank pain  Abdominal pain, right lower quadrant    New Prescriptions   CYCLOBENZAPRINE (FLEXERIL) 5 MG TABLET    Take 1 tablet (5 mg total) by mouth 3 (three) times daily as needed (muscle soreness).   NAPROXEN (NAPROSYN) 500 MG TABLET    Take 1 po BID with food prn pain    Plan discharge  Christy Albe, MD, Concha Pyo, MD 07/05/15 [phone]

## 2015-07-05 LAB — PREGNANCY, URINE: Preg Test, Ur: NEGATIVE

## 2015-07-05 MED ORDER — CYCLOBENZAPRINE HCL 5 MG PO TABS: 5.0000 mg | ORAL_TABLET | Freq: Three times a day (TID) | ORAL | Status: DC | PRN

## 2015-07-05 MED ORDER — NAPROXEN 500 MG PO TABS: ORAL_TABLET | ORAL | Status: DC

## 2015-07-05 NOTE — Discharge Instructions (Signed)
Try ice and heat to the painful areas. Your CT scan does not show a reason for your pain, no inflammation of your intestines, gallbladder, appendix, kidneys or abnormalities of your spine bones. No pneumonia. You did have a fatty liver that can be followed up with your primary care doctor.   Take the medications as prescribed.   Recheck if you get a cough, fever, get short of breath, vomiting or diarrhea.    Flank Pain Flank pain refers to pain that is located on the side of the body between the upper abdomen and the back. The pain may occur over a short period of time (acute) or may be long-term or reoccurring (chronic). It may be mild or severe. Flank pain can be caused by many things. CAUSES  Some of the more common causes of flank pain include:  Muscle strains.   Muscle spasms.   A disease of your spine (vertebral disk disease).   A lung infection (pneumonia).   Fluid around your lungs (pulmonary edema).   A kidney infection.   Kidney stones.   A very painful skin rash caused by the chickenpox virus (shingles).   Gallbladder disease.  HOME CARE INSTRUCTIONS  Home care will depend on the cause of your pain. In general,  Rest as directed by your caregiver.  Drink enough fluids to keep your urine clear or pale yellow.  Only take over-the-counter or prescription medicines as directed by your caregiver. Some medicines may help relieve the pain.  Tell your caregiver about any changes in your pain.  Follow up with your caregiver as directed. SEEK IMMEDIATE MEDICAL CARE IF:   Your pain is not controlled with medicine.   You have new or worsening symptoms.  Your pain increases.   You have abdominal pain.   You have shortness of breath.   You have persistent nausea or vomiting.   You have swelling in your abdomen.   You feel faint or pass out.   You have blood in your urine.  You have a fever or persistent symptoms for more than 2-3 days.  You have  a fever and your symptoms suddenly get worse. MAKE SURE YOU:   Understand these instructions.  Will watch your condition.  Will get help right away if you are not doing well or get worse.   This information is not intended to replace advice given to you by your health care provider. Make sure you discuss any questions you have with your health care provider.   Document Released: 04/12/2005 Document Revised: 11/14/2011 Document Reviewed: 10/04/2011 Elsevier Interactive Patient Education [employer]2016 [employer]

## 2017-04-11 DIAGNOSIS — F410 Panic disorder [episodic paroxysmal anxiety] without agoraphobia: Secondary | ICD-10-CM | POA: Insufficient documentation

## 2017-04-11 DIAGNOSIS — F411 Generalized anxiety disorder: Secondary | ICD-10-CM

## 2017-07-22 NOTE — Progress Notes (Signed)
Christy Howell is a 42 y.o. female presents to office today for annual physical exam examination.    Concerns today include: 1. Bilateral hand numbness Patient reports a long-standing history of bilateral hand numbness.  She notes that it is gotten progressively worse over the last several years.  She does note that she had this worked up previously by neurology who did determined that she had carpal tunnel syndrome in the right hand by EMG.  She notes that she braces her wrists and uses Goody powders several times daily for inflammation.  Symptoms are not improving.  She would like to see a specialist if possible.  2.  Choking Patient reports that she has been having choking episodes and difficulty swallowing solids such as breads since 2016.  She did see a provider in [place], who she thinks was a gastroenterologist.  She notes that she was started on a PPI but she had already been taking acid reflux medication.  She noted little improvement in symptoms and never returned.  She would like to see a provider in [place] if possible.  Denies any hematochezia, melena, unplanned weight loss.  She simply avoids bread and things that easily choke her.  She does note that she does seem to have a globus sensation at times as well.  Family history significant for rectal cancer in her maternal grandmother in her 58s.  Occupation: Owns Administrator, Marital status: married, Substance use: tobacco use.  Smokes daily and has done so since age 9. Diet: Drinks a lot of energy drinks, Exercise: Is very active at work.  She is actively trying to lose weight. Last mammogram: wants. She turns 42 in June. Last pap smear: UTD.  Sees [facility], Dr Georgina Peer.  Has IUD.  Removal due 2020. Refills needed today: none Immunizations needed: TDap  Past Medical History:  Diagnosis Date  . Anxiety   . Carpal tunnel syndrome on both sides      Social History   Socioeconomic History  . Marital status:  Married    Spouse name: Not on file  . Number of children: Not on file  . Years of education: Not on file  . Highest education level: Not on file  Occupational History  . Not on file  Social Needs  . Financial resource strain: Not on file  . Food insecurity:    Worry: Not on file    Inability: Not on file  . Transportation needs:    Medical: Not on file    Non-medical: Not on file  Tobacco Use  . Smoking status: Current Every Day Smoker    Packs/day: 1.00    Years: 25.00    Pack years: 25.00    Types: Cigarettes  . Smokeless tobacco: Never Used  Substance and Sexual Activity  . Alcohol use: No  . Drug use: No  . Sexual activity: Yes    Birth control/protection: Surgical, IUD    Comment: IUD due for removal 2020  Lifestyle  . Physical activity:    Days per week: Not on file    Minutes per session: Not on file  . Stress: Not on file  Relationships  . Social connections:    Talks on phone: Not on file    Gets together: Not on file    Attends religious service: Not on file    Active member of club or organization: Not on file    Attends meetings of clubs or organizations: Not on file  Relationship status: Not on file  . Intimate partner violence:    Fear of current or ex partner: Not on file    Emotionally abused: Not on file    Physically abused: Not on file    Forced sexual activity: Not on file  Other Topics Concern  . Not on file  Social History Narrative   Married, 3 children   Right handed   12 th grade   4-5 16 oz diet sodas daily   Past Surgical History:  Procedure Laterality Date  . CESAREAN SECTION    . TUBAL LIGATION     Family History  Problem Relation Age of Onset  . Rectal cancer Maternal Grandmother     Current Outpatient Medications:  .  Aspirin-Acetaminophen-Caffeine (GOODY HEADACHE PO), Take 1 packet by mouth daily as needed (for pain)., Disp: , Rfl:  .  PARoxetine (PAXIL) 20 MG tablet, Take 20 mg by mouth daily., Disp: , Rfl:  .   levonorgestrel (MIRENA, 52 MG,) 20 MCG/24HR IUD, 20 mcg by Intrauterine route once., Disp: , Rfl:   Allergies  Allergen Reactions  . Strawberry Extract Hives    blistering  . Penicillins Rash    ROS: Review of Systems Constitutional: negative Eyes: negative Ears, nose, mouth, throat, and face: positive for difficulty swallowing Respiratory: negative Cardiovascular: negative Gastrointestinal: negative Genitourinary:negative Integument/breast: negative Hematologic/lymphatic: negative Musculoskeletal:positive for numbness in bilateral hands.  some pain in right forearm with supination Neurological: negative Behavioral/Psych: positive for anxiety and tobacco use Endocrine: negative Allergic/Immunologic: negative    Physical exam BP 115/74   Pulse 69   Temp (!) 97.5 F (36.4 C) (Oral)   Ht _0  (1.549 m)   Wt 192 lb (87.1 kg)   BMI 36.28 kg/m  General appearance: alert, cooperative, appears stated age, no distress and morbidly obese Head: Normocephalic, without obvious abnormality, atraumatic Eyes: negative findings: lids and lashes normal, conjunctivae and sclerae normal, corneas clear and pupils equal, round, reactive to light and accomodation Ears: normal TM's and external ear canals both ears Nose: Nares normal. Septum midline. Mucosa normal. No drainage or sinus tenderness. Throat: lips, mucosa, and tongue normal; teeth and gums normal Neck: no adenopathy, no carotid bruit, no JVD, supple, symmetrical, trachea midline and thyroid not enlarged, symmetric, no tenderness/mass/nodules Back: symmetric, no curvature. ROM normal. No CVA tenderness. Lungs: clear to auscultation bilaterally Breasts: normal appearance, no masses or tenderness Heart: regular rate and rhythm, S1, S2 normal, no murmur, click, rub or gallop Abdomen: soft, non-tender; bowel sounds normal; no masses,  no organomegaly Pelvic: deferred Extremities: extremities normal, atraumatic, no cyanosis or  edema Pulses: 2+ and symmetric Skin: Skin color, texture, turgor normal. No rashes or lesions Lymph nodes: Cervical, supraclavicular, and axillary nodes normal. Neurologic: Alert and oriented X 3, normal strength and tone. Normal symmetric reflexes. Normal coordination and gait  MSK: Positive reverse Phalen's, Phalen's and Tinel's.  She also has tenderness to palpation along the muscle belly of the forearm on the right. Psych: mood stable, speech normal, affect appropriate.  Depression screen PHQ 2/9 07/24/2017  Decreased Interest 1  Down, Depressed, Hopeless 1  PHQ - 2 Score 2  Altered sleeping 3  Tired, decreased energy 3  Change in appetite 1  Feeling bad or failure about yourself  1  Trouble concentrating 1  Moving slowly or fidgety/restless 0  Suicidal thoughts 0  PHQ-9 Score 11   GAD 7 : Generalized Anxiety Score 07/24/2017  Nervous, Anxious, on Edge 1  Control/stop worrying 1  Worry too much - different things 1  Trouble relaxing 2  Restless 3  Easily annoyed or irritable 1  Afraid - awful might happen 0  Total GAD 7 Score 9   Assessment/ Plan: Rhetta Mura here for annual physical exam.   1. Adult BMI 36.0-36.9 kg/sq m Healthy lifestyle choices recommended, including diet (rich in fruits, vegetables and lean meats and low in salt and simple carbohydrates) and exercise (at least 30 minutes of moderate physical activity daily). - CMP14+EGFR - Lipid Panel - CBC with Differential  2. Encounter to establish care with new doctor [facility] to OB/GYN for pap.  3. Screening for metabolic disorder - BIP77+PZPS  4. Screening for lipid disorders - Lipid Panel  5. Screening, anemia, deficiency, iron - CBC with Differential  6. Bilateral carpal tunnel syndrome Refractory to conservative measures.  Will place referral to orthopedic surgery in [facility] for further evaluation, consideration of corticosteroid injection versus surgical release. - Ambulatory referral to  Orthopedic Surgery  7. Difficulty swallowing solids Long-standing issue for patient.  I do question if she has some type of stricture within the esophagus.  Will place referral to gastroenterology for direct visualization.  Would love their input as well as to colon cancer screening timing and recommendations for this patient, who has a family history of rectal cancer. - Ambulatory referral to Gastroenterology  8. Globus sensation - Ambulatory referral to Gastroenterology  9. Family history of rectal cancer - Ambulatory referral to Gastroenterology  10. Tobacco use Counseling performed.  Patient is contemplative and does wish to discontinue smoking.  We will work on the above measures and continue to address this issue.  Patient to follow up in 1 year for annual exam or sooner if needed.  Shykeria Sakamoto M. Lajuana Ripple, DO

## 2017-07-24 ENCOUNTER — Ambulatory Visit (INDEPENDENT_AMBULATORY_CARE_PROVIDER_SITE_OTHER): Payer: Self-pay | Admitting: Family Medicine

## 2017-07-24 ENCOUNTER — Encounter: Payer: Self-pay | Admitting: Physician Assistant

## 2017-07-24 ENCOUNTER — Encounter: Payer: Self-pay | Admitting: Family Medicine

## 2017-07-24 ENCOUNTER — Encounter: Payer: Self-pay | Admitting: Nurse Practitioner

## 2017-07-24 VITALS — BP 115/74 | HR 69 | Temp 97.5°F | Ht 61.0 in | Wt 192.0 lb

## 2017-07-24 DIAGNOSIS — Z23 Encounter for immunization: Secondary | ICD-10-CM

## 2017-07-24 DIAGNOSIS — R0989 Other specified symptoms and signs involving the circulatory and respiratory systems: Secondary | ICD-10-CM

## 2017-07-24 DIAGNOSIS — Z0000 Encounter for general adult medical examination without abnormal findings: Secondary | ICD-10-CM

## 2017-07-24 DIAGNOSIS — Z6836 Body mass index (BMI) 36.0-36.9, adult: Secondary | ICD-10-CM

## 2017-07-24 DIAGNOSIS — Z720 Tobacco use: Secondary | ICD-10-CM | POA: Insufficient documentation

## 2017-07-24 DIAGNOSIS — Z7689 Persons encountering health services in other specified circumstances: Secondary | ICD-10-CM

## 2017-07-24 DIAGNOSIS — G5603 Carpal tunnel syndrome, bilateral upper limbs: Secondary | ICD-10-CM

## 2017-07-24 DIAGNOSIS — Z800 Family history of malignant neoplasm of digestive organs: Secondary | ICD-10-CM

## 2017-07-24 DIAGNOSIS — Z130 Encounter for screening for diseases of the blood and blood-forming organs and certain disorders involving the immune mechanism: Secondary | ICD-10-CM

## 2017-07-24 DIAGNOSIS — Z13220 Encounter for screening for lipoid disorders: Secondary | ICD-10-CM

## 2017-07-24 DIAGNOSIS — R1310 Dysphagia, unspecified: Secondary | ICD-10-CM

## 2017-07-24 DIAGNOSIS — Z13228 Encounter for screening for other metabolic disorders: Secondary | ICD-10-CM

## 2017-07-24 LAB — CMP14+EGFR
ALK PHOS: 53 IU/L (ref 39–117)
ALT: 30 IU/L (ref 0–32)
AST: 15 IU/L (ref 0–40)
Albumin/Globulin Ratio: 1.8 (ref 1.2–2.2)
Albumin: 4.6 g/dL (ref 3.5–5.5)
BUN/Creatinine Ratio: 14 (ref 9–23)
BUN: 10 mg/dL (ref 6–20)
Bilirubin Total: 0.2 mg/dL (ref 0.0–1.2)
CO2: 24 mmol/L (ref 20–29)
CREATININE: 0.69 mg/dL (ref 0.57–1.00)
Calcium: 9.3 mg/dL (ref 8.7–10.2)
Chloride: 102 mmol/L (ref 96–106)
GFR calc Af Amer: 127 mL/min/{1.73_m2} (ref >59)
GFR, EST NON AFRICAN AMERICAN: 110 mL/min/{1.73_m2} (ref >59)
Globulin, Total: 2.5 g/dL (ref 1.5–4.5)
Glucose: 98 mg/dL (ref 65–99)
Potassium: 3.9 mmol/L (ref 3.5–5.2)
SODIUM: 140 mmol/L (ref 134–144)
Total Protein: 7.1 g/dL (ref 6.0–8.5)

## 2017-07-24 LAB — LIPID PANEL
CHOL/HDL RATIO: 3.6 ratio (ref 0.0–4.4)
Cholesterol, Total: 165 mg/dL (ref 100–199)
HDL: 46 mg/dL (ref >39)
LDL Calculated: 100 mg/dL — ABNORMAL HIGH (ref 0–99)
TRIGLYCERIDES: 97 mg/dL (ref 0–149)
VLDL CHOLESTEROL CAL: 19 mg/dL (ref 5–40)

## 2017-07-24 LAB — CBC WITH DIFFERENTIAL/PLATELET
BASOS: 1 %
Basophils Absolute: 0 10*3/uL (ref 0.0–0.2)
EOS (ABSOLUTE): 0.1 10*3/uL (ref 0.0–0.4)
Eos: 2 %
Hematocrit: 41.4 % (ref 34.0–46.6)
Hemoglobin: 14 g/dL (ref 11.1–15.9)
IMMATURE GRANS (ABS): 0 10*3/uL (ref 0.0–0.1)
Immature Granulocytes: 0 %
LYMPHS ABS: 1.6 10*3/uL (ref 0.7–3.1)
LYMPHS: 25 %
MCH: 29.7 pg (ref 26.6–33.0)
MCHC: 33.8 g/dL (ref 31.5–35.7)
MCV: 88 fL (ref 79–97)
Monocytes Absolute: 0.7 10*3/uL (ref 0.1–0.9)
Monocytes: 11 %
Neutrophils Absolute: 4 10*3/uL (ref 1.4–7.0)
Neutrophils: 61 %
Platelets: 233 10*3/uL (ref 150–450)
RBC: 4.71 x10E6/uL (ref 3.77–5.28)
RDW: 12.7 % (ref 12.3–15.4)
WBC: 6.4 10*3/uL (ref 3.4–10.8)

## 2017-07-24 NOTE — Patient Instructions (Addendum)
You had labs performed today.  You will be contacted with the results of the labs once they are available, usually in the next 3 days for routine lab work.  I placed a referral to the orthopedist for your carpal tunnel syndrome and your forearm pain.  I have also placed a referral to the gastroenterologist in [place] for your difficulty swallowing.  Contact me if you do not hear from them for an appointment.   Steps to Quit Smoking Smoking tobacco can be harmful to your health and can affect almost every organ in your body. Smoking puts you, and those around you, at risk for developing many serious chronic diseases. Quitting smoking is difficult, but it is one of the best things that you can do for your health. It is never too late to quit. What are the benefits of quitting smoking? When you quit smoking, you lower your risk of developing serious diseases and conditions, such as:  Lung cancer or lung disease, such as COPD.  Heart disease.  Stroke.  Heart attack.  Infertility.  Osteoporosis and bone fractures.  Additionally, symptoms such as coughing, wheezing, and shortness of breath may get better when you quit. You may also find that you get sick less often because your body is stronger at fighting off colds and infections. If you are pregnant, quitting smoking can help to reduce your chances of having a baby of low birth weight. How do I get ready to quit? When you decide to quit smoking, create a plan to make sure that you are successful. Before you quit:  Pick a date to quit. Set a date within the next two weeks to give you time to prepare.  Write down the reasons why you are quitting. Keep this list in places where you will see it often, such as on your bathroom mirror or in your car or wallet.  Identify the people, places, things, and activities that make you want to smoke (triggers) and avoid them. Make sure to take these actions: ? Throw away all cigarettes at home, at  work, and in your car. ? Throw away smoking accessories, such as Set designer. ? Clean your car and make sure to empty the ashtray. ? Clean your home, including curtains and carpets.  Tell your family, friends, and coworkers that you are quitting. Support from your loved ones can make quitting easier.  Talk with your health care provider about your options for quitting smoking.  Find out what treatment options are covered by your health insurance.  What strategies can I use to quit smoking? Talk with your healthcare provider about different strategies to quit smoking. Some strategies include:  Quitting smoking altogether instead of gradually lessening how much you smoke over a period of time. Research shows that quitting "cold [place]" is more successful than gradually quitting.  Attending in-person counseling to help you build problem-solving skills. You are more likely to have success in quitting if you attend several counseling sessions. Even short sessions of 10 minutes can be effective.  Finding resources and support systems that can help you to quit smoking and remain smoke-free after you quit. These resources are most helpful when you use them often. They can include: ? Online chats with a Veterinary surgeon. ? Telephone quitlines. ? Automotive engineer. ? Support groups or group counseling. ? Text messaging programs. ? Mobile phone applications.  Taking medicines to help you quit smoking. (If you are pregnant or breastfeeding, talk with your health care provider  first.) Some medicines contain nicotine and some do not. Both types of medicines help with cravings, but the medicines that include nicotine help to relieve withdrawal symptoms. Your health care provider may recommend: ? Nicotine patches, gum, or lozenges. ? Nicotine inhalers or sprays. ? Non-nicotine medicine that is taken by mouth.  Talk with your health care provider about combining strategies, such as taking  medicines while you are also receiving in-person counseling. Using these two strategies together makes you more likely to succeed in quitting than if you used either strategy on its own. If you are pregnant or breastfeeding, talk with your health care provider about finding counseling or other support strategies to quit smoking. Do not take medicine to help you quit smoking unless told to do so by your health care provider. What things can I do to make it easier to quit? Quitting smoking might feel overwhelming at first, but there is a lot that you can do to make it easier. Take these important actions:  Reach out to your family and friends and ask that they support and encourage you during this time. Call telephone quitlines, reach out to support groups, or work with a counselor for support.  Ask people who smoke to avoid smoking around you.  Avoid places that trigger you to smoke, such as bars, parties, or smoke-break areas at work.  Spend time around people who do not smoke.  Lessen stress in your life, because stress can be a smoking trigger for some people. To lessen stress, try: ? Exercising regularly. ? Deep-breathing exercises. ? Yoga. ? Meditating. ? Performing a body scan. This involves closing your eyes, scanning your body from head to toe, and noticing which parts of your body are particularly tense. Purposefully relax the muscles in those areas.  Download or purchase mobile phone or tablet apps (applications) that can help you stick to your quit plan by providing reminders, tips, and encouragement. There are many free apps, such as QuitGuide from the [employer] developer for Disease Control and Prevention). You can find other support for quitting smoking (smoking cessation) through smokefree.gov and other websites.  How will I feel when I quit smoking? Within the first 24 hours of quitting smoking, you may start to feel some withdrawal symptoms. These symptoms are usually most noticeable  2-3 days after quitting, but they usually do not last beyond 2-3 weeks. Changes or symptoms that you might experience include:  Mood swings.  Restlessness, anxiety, or irritation.  Difficulty concentrating.  Dizziness.  Strong cravings for sugary foods in addition to nicotine.  Mild weight gain.  Constipation.  Nausea.  Coughing or a sore throat.  Changes in how your medicines work in your body.  A depressed mood.  Difficulty sleeping (insomnia).  After the first 2-3 weeks of quitting, you may start to notice more positive results, such as:  Improved sense of smell and taste.  Decreased coughing and sore throat.  Slower heart rate.  Lower blood pressure.  Clearer skin.  The ability to breathe more easily.  Fewer sick days.  Quitting smoking is very challenging for most people. Do not get discouraged if you are not successful the first time. Some people need to make many attempts to quit before they achieve long-term success. Do your best to stick to your quit plan, and talk with your health care provider if you have any questions or concerns. This information is not intended to replace advice given to you by your health care provider. Make sure  you discuss any questions you have with your health care provider. Document Released: 02/13/2001 Document Revised: 10/18/2015 Document Reviewed: 07/06/2014 Elsevier Interactive Patient Education  [employer].

## 2017-07-24 NOTE — Addendum Note (Signed)
Addended byDory Peru on: 07/24/2017 02:01 PM   Modules accepted: Orders

## 2017-07-31 ENCOUNTER — Encounter (INDEPENDENT_AMBULATORY_CARE_PROVIDER_SITE_OTHER): Payer: Self-pay | Admitting: Orthopaedic Surgery

## 2017-07-31 ENCOUNTER — Ambulatory Visit (INDEPENDENT_AMBULATORY_CARE_PROVIDER_SITE_OTHER): Payer: Self-pay | Admitting: Orthopaedic Surgery

## 2017-07-31 DIAGNOSIS — R202 Paresthesia of skin: Secondary | ICD-10-CM

## 2017-07-31 DIAGNOSIS — R200 Anesthesia of skin: Secondary | ICD-10-CM

## 2017-07-31 DIAGNOSIS — M79631 Pain in right forearm: Secondary | ICD-10-CM

## 2017-07-31 MED ORDER — MELOXICAM 7.5 MG PO TABS: 7.5000 mg | ORAL_TABLET | Freq: Two times a day (BID) | ORAL | 2 refills | Status: DC | PRN

## 2017-07-31 NOTE — Progress Notes (Signed)
Office Visit Note   Patient: Christy Howell           Date of Birth: 08-16-1975           MRN: [id number] Visit Date: 07/31/2017              Requested by: Raliegh Ip, DO [address] [place], [place] [address] PCP: Raliegh Ip, DO   Assessment & Plan: Visit Diagnoses:  1. Right forearm pain   2. Numbness and tingling in right hand     Plan: Impression is right forearm pain likely tendinitis and right carpal tunnel syndrome.  For the forearm pain I recommend symptomatic treatment with rest, stretching, NSAIDs as needed, activity modification.  We will obtain an EMG to assess for any progression of her carpal tunnel syndrome.  Follow-up afterwards.  Prescription for meloxicam.  Follow-Up Instructions: Return if symptoms worsen or fail to improve.   Orders:  Orders Placed This Encounter  Procedures  . Ambulatory referral to Physical Medicine Rehab   Meds ordered this encounter  Medications  . meloxicam (MOBIC) 7.5 MG tablet    Sig: Take 1 tablet (7.5 mg total) by mouth 2 (two) times daily as needed for pain.    Dispense:  30 tablet    Refill:  2  . meloxicam (MOBIC) 7.5 MG tablet    Sig: Take 1 tablet (7.5 mg total) by mouth 2 (two) times daily as needed for pain.    Dispense:  30 tablet    Refill:  2      Procedures: No procedures performed   Clinical Data: No additional findings.   Subjective: Chief Complaint  Patient presents with  . Right Forearm - Pain  . Right Wrist - Pain, Numbness    Patient is a pleasant 42 year old female who comes in with forearm pain on the dorsal aspect and numbness and tingling in her hands.  She was previously diagnosed with mild carpal tunnel syndrome about 5 years ago but she feels like this has gotten worse.  She also complains of forearm pain in the extensor muscle mass that is worse with gripping things and lifting them.  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   Review of Systems    Constitutional: Negative.   HENT: Negative.   Eyes: Negative.   Respiratory: Negative.   Cardiovascular: Negative.   Endocrine: Negative.   Musculoskeletal: Negative.   Neurological: Negative.   Hematological: Negative.   Psychiatric/Behavioral: Negative.   All other systems reviewed and are negative.    Objective: Vital Signs: There were no vitals taken for this visit.  Physical Exam  Constitutional: She is oriented to person, place, and time. She appears well-developed and well-nourished.  HENT:  Head: Normocephalic and atraumatic.  Eyes: EOM are normal.  Neck: Neck supple.  Pulmonary/Chest: Effort normal.  Abdominal: Soft.  Neurological: She is alert and oriented to person, place, and time.  Skin: Skin is warm. Capillary refill takes less than 2 seconds.  Psychiatric: She has a normal mood and affect. Her behavior is normal. Judgment and thought content normal.  Nursing note and vitals reviewed.   Ortho Exam Right hand and forearm exam shows vague discomfort with palpation in the extensor mobile wad.  Lateral epicondyle is not overly tender.  Negative Tinel at radial tunnel.  No significant pain with resisted wrist extension or ECRB extension.  Positive carpal tunnel compressive signs.  Subjective decreased sensation in the median  nerve distribution. Specialty Comments:  No specialty comments available.  Imaging: No results found.   PMFS History: Patient Active Problem List   Diagnosis Date Noted  . Tobacco use 07/24/2017  . Anxiety 04/11/2017  . CTS (carpal tunnel syndrome) 06/29/2013   Past Medical History:  Diagnosis Date  . Anxiety   . Carpal tunnel syndrome on both sides     Family History  Problem Relation Age of Onset  . Rectal cancer Maternal Grandmother     Past Surgical History:  Procedure Laterality Date  . CESAREAN SECTION    . TUBAL LIGATION     Social History   Occupational History  . Not on file  Tobacco Use  . Smoking status:  Current Every Day Smoker    Packs/day: 1.00    Years: 25.00    Pack years: 25.00    Types: Cigarettes  . Smokeless tobacco: Never Used  Substance and Sexual Activity  . Alcohol use: No  . Drug use: No  . Sexual activity: Yes    Birth control/protection: Surgical, IUD    Comment: IUD due for removal 2020

## 2017-08-15 ENCOUNTER — Encounter: Payer: Self-pay | Admitting: Physician Assistant

## 2017-08-15 ENCOUNTER — Ambulatory Visit: Payer: Self-pay | Admitting: Physician Assistant

## 2017-08-15 VITALS — BP 120/70 | HR 66 | Ht 60.0 in | Wt 195.0 lb

## 2017-08-15 DIAGNOSIS — R1310 Dysphagia, unspecified: Secondary | ICD-10-CM

## 2017-08-15 NOTE — Progress Notes (Signed)
Subjective:    Patient ID: Christy RainwaterJuni A Howell, female    DOB: 09-Jun-1975, 42 y.o.   MRN: [id number]  HPI Christy MortJuni is a pleasant 42 year old white female, new to GI today referred by [facility]/Ashley Gottschalk DO for evaluation of dysphasia. Patient says she was seen by a gastroenterologist for 5 years ago in [place] for similar symptoms but did not undergo any testing. She says she has had her current symptoms for at least 4 to 5 years and has noticed some progression over the past year.  She relates intermittent episodes of food getting "lodged" in her esophagus to the point that she has to regurgitate.  She says this is very uncomfortable when it happens and makes her feel panicky.  Occasionally she will be able to stop eating and his weight for the food to traverse her esophagus but most of the time this requires regurgitation. She thinks she is having an episode 5 or 6 times a month currently and it generally happens with meat or bread, especially cornbread.  In between these episodes she does not have any difficulty swallowing.  No difficulty with liquids.  She denies any heartburn or indigestion. No complaints of abdominal discomfort, changes in bowel habits melena or hematochezia. Family history pertinent for maternal grandmother with rectal cancer, otherwise negative for GI diseases.  Review of Systems Pertinent positive and negative review of systems were noted in the above HPI section.  All other review of systems was otherwise negative.  Outpatient Encounter Medications as of 08/15/2017  Medication Sig  . Aspirin-Acetaminophen-Caffeine (GOODY HEADACHE PO) Take 1 packet by mouth daily as needed (for pain).  Marland Kitchen. levonorgestrel (MIRENA, 52 MG,) 20 MCG/24HR IUD 20 mcg by Intrauterine route once.  . meloxicam (MOBIC) 7.5 MG tablet Take 1 tablet (7.5 mg total) by mouth 2 (two) times daily as needed for pain.  Marland Kitchen. PARoxetine (PAXIL) 20 MG tablet Take 20 mg by mouth daily.  .  [DISCONTINUED] meloxicam (MOBIC) 7.5 MG tablet Take 1 tablet (7.5 mg total) by mouth 2 (two) times daily as needed for pain.   No facility-administered encounter medications on file as of 08/15/2017.    Allergies  Allergen Reactions  . Kiwi Extract     Throat itches and hives  . Strawberry Extract Hives    blistering  . Penicillins Rash   Patient Active Problem List   Diagnosis Date Noted  . Tobacco use 07/24/2017  . Anxiety 04/11/2017  . CTS (carpal tunnel syndrome) 06/29/2013   Social History   Socioeconomic History  . Marital status: Married    Spouse name: Not on file  . Number of children: 3  . Years of education: Not on file  . Highest education level: Not on file  Occupational History  . Occupation: runs a [employer]  Social Needs  . Financial resource strain: Not on file  . Food insecurity:    Worry: Not on file    Inability: Not on file  . Transportation needs:    Medical: Not on file    Non-medical: Not on file  Tobacco Use  . Smoking status: Current Every Day Smoker    Packs/day: 1.00    Years: 25.00    Pack years: 25.00    Types: Cigarettes  . Smokeless tobacco: Never Used  . Tobacco comment: tobacco info given  Substance and Sexual Activity  . Alcohol use: No  . Drug use: No  . Sexual activity: Yes    Birth control/protection: Surgical, IUD  Comment: IUD due for removal 2020  Lifestyle  . Physical activity:    Days per week: Not on file    Minutes per session: Not on file  . Stress: Not on file  Relationships  . Social connections:    Talks on phone: Not on file    Gets together: Not on file    Attends religious service: Not on file    Active member of club or organization: Not on file    Attends meetings of clubs or organizations: Not on file    Relationship status: Not on file  . Intimate partner violence:    Fear of current or ex partner: Not on file    Emotionally abused: Not on file    Physically abused: Not on file    Forced sexual  activity: Not on file  Other Topics Concern  . Not on file  Social History Narrative   Married, 3 children   Right handed   12 th grade   4-5 16 oz diet sodas daily    Ms. Schmale's family history includes Rectal cancer in her maternal grandmother.      Objective:    Vitals:   08/15/17 0913  BP: 120/70  Pulse: 66    Physical Exam well-developed white female in no acute distress, pleasant blood pressure 120/70 pulse 66, height 5 foot, weight 195, BMI 38.0.  HEENT; nontraumatic normocephalic EOMI PERRLA sclera anicteric, Oropharynx clear, Cardiovascular ;regular rate and rhythm with S1-S2 no murmur rub or gallop, Pulmonary; clear bilaterally, Abdomen; obese, soft nontender nondistended bowel sounds are active there is no palpable mass or hepatosplenomegaly.  Rectal ;exam not done, Extremity's; no clubbing cyanosis or edema skin warm and dry, Neuro psych; mood and affect appropriate, alert and oriented, grossly nonfocal       Assessment & Plan:   #46 42 year old white female with intermittent solid food dysphasia over the past 4 to 5 years now with progression in symptoms over the past 1 year with episodes occurring 5-6 times per month. Generally these acute episodes require regurgitation to dislodge the food bolus.  Rule out esophageal stricture versus esophageal ring, less likely motility disorder  #2 history of anxiety  Plan; Options were discussed with the patient including initial barium swallow, versus proceeding to EGD with probable dilation.  Procedure was discussed in detail with the patient including indications risks and benefits and she is agreeable to proceeding with EGD.  This will be scheduled with Dr. Myrtie Neither. Depending on findings at EGD she may benefit from chronic  acid suppression,   Amy S Esterwood PA-C 08/15/2017   Cc: Raliegh Ip, DO

## 2017-08-15 NOTE — Patient Instructions (Signed)

## 2017-08-16 NOTE — Progress Notes (Signed)
Thank you for sending this case to me. I have reviewed the entire note, and the outlined plan seems appropriate.   Giorgia Wahler Danis, MD  

## 2017-08-23 ENCOUNTER — Ambulatory Visit (INDEPENDENT_AMBULATORY_CARE_PROVIDER_SITE_OTHER): Payer: Self-pay | Admitting: Physical Medicine and Rehabilitation

## 2017-08-23 ENCOUNTER — Encounter (INDEPENDENT_AMBULATORY_CARE_PROVIDER_SITE_OTHER): Payer: Self-pay | Admitting: Physical Medicine and Rehabilitation

## 2017-08-23 DIAGNOSIS — R202 Paresthesia of skin: Secondary | ICD-10-CM

## 2017-08-23 NOTE — Progress Notes (Signed)
 .  Numeric Pain Rating Scale and Functional Assessment Average Pain 10   In the last MONTH (on 0-10 scale) has pain interfered with the following?  1. General activity like being  able to carry out your everyday physical activities such as walking, climbing stairs, carrying groceries, or moving a chair?  Rating(6)    

## 2017-08-26 ENCOUNTER — Ambulatory Visit: Payer: Self-pay

## 2017-08-26 NOTE — Progress Notes (Signed)
Christy Howell - 42 y.o. female MRN [id number]  Date of birth: 07-Aug-1975  Office Visit Note: Visit Date: 08/23/2017 PCP: Raliegh Ip, DO Referred by: Raliegh Ip, DO  Subjective: Chief Complaint  Patient presents with  . Right Hand - Pain, Numbness  . Right Forearm - Numbness, Pain   HPI: Christy Howell is a 42 year old right-hand-dominant female that comes in today at the request of Dr. Glee Arvin for electrodiagnostic study of the right upper upper limb.  She endorses chronic worsening pain and numbness in the right arm and right hand particularly in the thumb index and middle finger on the right.  She reports symptoms started years ago but have progressively gotten worse.  She reports some pain into the forearm as well with activity and gripping.  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  She does get nocturnal complaints with a positive flick sign.  She reports having an arm off the bed does make it better.  She reports worsening numbness with driving.  She also reports some left-sided complaints that are very similar but just very much more mild.  She rates her pain as a 10 out of 10.  She has not had prior electrodiagnostic studies that we have her reviewed.   ROS Otherwise per HPI.  Assessment & Plan: Visit Diagnoses:  1. Paresthesia of skin     Plan: No additional findings.  Impression: The above electrodiagnostic study is ABNORMAL and reveals evidence of:  1.  A  moderate right median nerve entrapment at the wrist (carpal tunnel syndrome) affecting sensory and motor components.   2.  A mild to moderate left median nerve entrapment at the wrist (carpal tunnel syndrome) affecting sensory and motor components.    There is no significant electrodiagnostic evidence of any other focal nerve entrapment, brachial plexopathy or cervical radiculopathy. The above electrodiagnostic study is ABNORMAL and  reveals evidence of a mild and moderate left median nerve entrapment at the wrist (carpal tunnel syndrome) affecting sensory and motor components.   Recommendations: 1.  Follow-up with referring physician. 2.  Continue current management of symptoms. 3.  Continue use of resting splint at night-time and as needed during the day. 4.  Suggest surgical evaluation.   Meds & Orders: No orders of the defined types were placed in this encounter.   Orders Placed This Encounter  Procedures  . NCV with EMG (electromyography)    Follow-up: Return for Dr. Glee Arvin.   Procedures: No procedures performed  EMG & NCV Findings: Evaluation of the left median motor and the right median motor nerves showed prolonged distal onset latency (L4.3, R4.8 ms) and decreased conduction velocity (Elbow-Wrist, L47, R47 m/s).  The left median (across palm) sensory nerve showed prolonged distal peak latency (Wrist, 4.2 ms).  The right median (across palm) sensory nerve showed prolonged distal peak latency (Wrist, 5.6 ms), reduced amplitude (6.2 V), and prolonged distal peak latency (Palm, 2.1 ms).  All remaining nerves (as indicated in the following tables) were within normal limits.  All left vs. right side differences were within normal limits.    All examined muscles (as indicated in the following table) showed no evidence of electrical instability.    Impression: The above electrodiagnostic study is ABNORMAL and reveals evidence of:  1.  A  moderate right median nerve entrapment at the wrist (carpal tunnel syndrome) affecting sensory and motor components.   2.  A mild  to moderate left median nerve entrapment at the wrist (carpal tunnel syndrome) affecting sensory and motor components.    There is no significant electrodiagnostic evidence of any other focal nerve entrapment, brachial plexopathy or cervical radiculopathy.   Recommendations: 1.  Follow-up with referring physician. 2.  Continue current  management of symptoms. 3.  Continue use of resting splint at night-time and as needed during the day. 4.  Suggest surgical evaluation.  Naaman PlummerFred Gauge Winski FAAPMR Board Certified, American Board of Physical Medicine and Rehabilitation    Nerve Conduction Studies Anti Sensory Summary Table   Stim Site NR Peak (ms) Norm Peak (ms) P-T Amp (V) Norm P-T Amp Site1 Site2 Delta-P (ms) Dist (cm) Vel (m/s) Norm Vel (m/s)  Left Median Acr Palm Anti Sensory (2nd Digit)  32.4C  Wrist    *4.2 <3.6 15.3 >10 Wrist Palm 2.4 0.0    Palm    1.8 <2.0 12.9         Right Median Acr Palm Anti Sensory (2nd Digit)  33.1C  Wrist    *5.6 <3.6 *6.2 >10 Wrist Palm 3.5 0.0    Palm    *2.1 <2.0 4.0         Right Radial Anti Sensory (Base 1st Digit)  32.6C  Wrist    2.0 <3.1 21.5  Wrist Base 1st Digit 2.0 0.0    Right Ulnar Anti Sensory (5th Digit)  32.8C  Wrist    2.8 <3.7 24.0 >15.0 Wrist 5th Digit 2.8 14.0 50 >38   Motor Summary Table   Stim Site NR Onset (ms) Norm Onset (ms) O-P Amp (mV) Norm O-P Amp Site1 Site2 Delta-0 (ms) Dist (cm) Vel (m/s) Norm Vel (m/s)  Left Median Motor (Abd Poll Brev)  32.6C  Wrist    *4.3 <4.2 6.0 >5 Elbow Wrist 3.5 16.5 *47 >50  Elbow    7.8  6.0         Right Median Motor (Abd Poll Brev)  32.4C  Wrist    *4.8 <4.2 7.1 >5 Elbow Wrist 3.6 17.0 *47 >50  Elbow    8.4  7.1         Right Ulnar Motor (Abd Dig Min)  32.3C  Wrist    2.8 <4.2 8.6 >3 B Elbow Wrist 2.5 16.5 66 >53  B Elbow    5.3  7.9  A Elbow B Elbow 1.4 10.0 71 >53  A Elbow    6.7  7.5          EMG   Side Muscle Nerve Root Ins Act Fibs Psw Amp Dur Poly Recrt Int Dennie BiblePat Comment  Right Abd Poll Brev Median C8-T1 Nml Nml Nml Nml Nml 0 Nml Nml   Right 1stDorInt Ulnar C8-T1 Nml Nml Nml Nml Nml 0 Nml Nml   Right PronatorTeres Median C6-7 Nml Nml Nml Nml Nml 0 Nml Nml   Right Biceps Musculocut C5-6 Nml Nml Nml Nml Nml 0 Nml Nml   Right Deltoid Axillary C5-6 Nml Nml Nml Nml Nml 0 Nml Nml     Nerve Conduction  Studies Anti Sensory Left/Right Comparison   Stim Site L Lat (ms) R Lat (ms) L-R Lat (ms) L Amp (V) R Amp (V) L-R Amp (%) Site1 Site2 L Vel (m/s) R Vel (m/s) L-R Vel (m/s)  Median Acr Palm Anti Sensory (2nd Digit)  32.4C  Wrist *4.2 *5.6 1.4 15.3 *6.2 59.5 Wrist Palm     Palm 1.8 *2.1 0.3 12.9 4.0 69.0  Radial Anti Sensory (Base 1st Digit)  32.6C  Wrist  2.0   21.5  Wrist Base 1st Digit     Ulnar Anti Sensory (5th Digit)  32.8C  Wrist  2.8   24.0  Wrist 5th Digit  50    Motor Left/Right Comparison   Stim Site L Lat (ms) R Lat (ms) L-R Lat (ms) L Amp (mV) R Amp (mV) L-R Amp (%) Site1 Site2 L Vel (m/s) R Vel (m/s) L-R Vel (m/s)  Median Motor (Abd Poll Brev)  32.6C  Wrist *4.3 *4.8 0.5 6.0 7.1 15.5 Elbow Wrist *47 *47 0  Elbow 7.8 8.4 0.6 6.0 7.1 15.5       Ulnar Motor (Abd Dig Min)  32.3C  Wrist  2.8   8.6  B Elbow Wrist  66   B Elbow  5.3   7.9  A Elbow B Elbow  71   A Elbow  6.7   7.5           Waveforms:                Clinical History: No specialty comments available.   She reports that she has been smoking cigarettes.  She has a 25.00 pack-year smoking history. She has never used smokeless tobacco. No results for input(s): HGBA1C, LABURIC in the last 8760 hours.  Objective:  VS:  HT:    WT:   BMI:     BP:   HR: bpm  TEMP: ( )  RESP:  Physical Exam  Musculoskeletal:  Inspection reveals no atrophy of the bilateral APB or FDI or hand intrinsics. There is no swelling, color changes, allodynia or dystrophic changes. There is 5 out of 5 strength in the bilateral wrist extension, finger abduction and long finger flexion.  There is subjective impaired sensation or dysesthesia on the right median nerve distribution compared to left.   There is a positive Phalen's test on the right. There is a negative Hoffmann's test bilaterally.    Ortho Exam Imaging: No results found.  Past Medical/Family/Surgical/Social History: Medications & Allergies reviewed per EMR,  new medications updated. Patient Active Problem List   Diagnosis Date Noted  . Tobacco use 07/24/2017  . Anxiety 04/11/2017  . CTS (carpal tunnel syndrome) 06/29/2013   Past Medical History:  Diagnosis Date  . Anxiety   . Carpal tunnel syndrome on both sides    Family History  Problem Relation Age of Onset  . Rectal cancer Maternal Grandmother   . Colon cancer Neg Hx   . Esophageal cancer Neg Hx    Past Surgical History:  Procedure Laterality Date  . CESAREAN SECTION     x 1  . MOUTH SURGERY     wisdom teeth  . TUBAL LIGATION     Social History   Occupational History  . Occupation: runs a restarant  Tobacco Use  . Smoking status: Current Every Day Smoker    Packs/day: 1.00    Years: 25.00    Pack years: 25.00    Types: Cigarettes  . Smokeless tobacco: Never Used  . Tobacco comment: tobacco info given  Substance and Sexual Activity  . Alcohol use: No  . Drug use: No  . Sexual activity: Yes    Birth control/protection: Surgical, IUD    Comment: IUD due for removal 2020

## 2017-08-26 NOTE — Procedures (Signed)
EMG & NCV Findings: Evaluation of the left median motor and the right median motor nerves showed prolonged distal onset latency (L4.3, R4.8 ms) and decreased conduction velocity (Elbow-Wrist, L47, R47 m/s).  The left median (across palm) sensory nerve showed prolonged distal peak latency (Wrist, 4.2 ms).  The right median (across palm) sensory nerve showed prolonged distal peak latency (Wrist, 5.6 ms), reduced amplitude (6.2 V), and prolonged distal peak latency (Palm, 2.1 ms).  All remaining nerves (as indicated in the following tables) were within normal limits.  All left vs. right side differences were within normal limits.    All examined muscles (as indicated in the following table) showed no evidence of electrical instability.    Impression: The above electrodiagnostic study is ABNORMAL and reveals evidence of:  1.  A  moderate right median nerve entrapment at the wrist (carpal tunnel syndrome) affecting sensory and motor components.   2.  A mild to moderate left median nerve entrapment at the wrist (carpal tunnel syndrome) affecting sensory and motor components.    There is no significant electrodiagnostic evidence of any other focal nerve entrapment, brachial plexopathy or cervical radiculopathy.   Recommendations: 1.  Follow-up with referring physician. 2.  Continue current management of symptoms. 3.  Continue use of resting splint at night-time and as needed during the day. 4.  Suggest surgical evaluation.  Naaman Plummer FAAPMR Board Certified, American Board of Physical Medicine and Rehabilitation    Nerve Conduction Studies Anti Sensory Summary Table   Stim Site NR Peak (ms) Norm Peak (ms) P-T Amp (V) Norm P-T Amp Site1 Site2 Delta-P (ms) Dist (cm) Vel (m/s) Norm Vel (m/s)  Left Median Acr Palm Anti Sensory (2nd Digit)  32.4C  Wrist    *4.2 <3.6 15.3 >10 Wrist Palm 2.4 0.0    Palm    1.8 <2.0 12.9         Right Median Acr Palm Anti Sensory (2nd Digit)  33.1C  Wrist     *5.6 <3.6 *6.2 >10 Wrist Palm 3.5 0.0    Palm    *2.1 <2.0 4.0         Right Radial Anti Sensory (Base 1st Digit)  32.6C  Wrist    2.0 <3.1 21.5  Wrist Base 1st Digit 2.0 0.0    Right Ulnar Anti Sensory (5th Digit)  32.8C  Wrist    2.8 <3.7 24.0 >15.0 Wrist 5th Digit 2.8 14.0 50 >38   Motor Summary Table   Stim Site NR Onset (ms) Norm Onset (ms) O-P Amp (mV) Norm O-P Amp Site1 Site2 Delta-0 (ms) Dist (cm) Vel (m/s) Norm Vel (m/s)  Left Median Motor (Abd Poll Brev)  32.6C  Wrist    *4.3 <4.2 6.0 >5 Elbow Wrist 3.5 16.5 *47 >50  Elbow    7.8  6.0         Right Median Motor (Abd Poll Brev)  32.4C  Wrist    *4.8 <4.2 7.1 >5 Elbow Wrist 3.6 17.0 *47 >50  Elbow    8.4  7.1         Right Ulnar Motor (Abd Dig Min)  32.3C  Wrist    2.8 <4.2 8.6 >3 B Elbow Wrist 2.5 16.5 66 >53  B Elbow    5.3  7.9  A Elbow B Elbow 1.4 10.0 71 >53  A Elbow    6.7  7.5          EMG   Side Muscle Nerve Root Ins Act Fibs Psw  Amp Dur Poly Recrt Int Dennie BiblePat Comment  Right Abd Poll Brev Median C8-T1 Nml Nml Nml Nml Nml 0 Nml Nml   Right 1stDorInt Ulnar C8-T1 Nml Nml Nml Nml Nml 0 Nml Nml   Right PronatorTeres Median C6-7 Nml Nml Nml Nml Nml 0 Nml Nml   Right Biceps Musculocut C5-6 Nml Nml Nml Nml Nml 0 Nml Nml   Right Deltoid Axillary C5-6 Nml Nml Nml Nml Nml 0 Nml Nml     Nerve Conduction Studies Anti Sensory Left/Right Comparison   Stim Site L Lat (ms) R Lat (ms) L-R Lat (ms) L Amp (V) R Amp (V) L-R Amp (%) Site1 Site2 L Vel (m/s) R Vel (m/s) L-R Vel (m/s)  Median Acr Palm Anti Sensory (2nd Digit)  32.4C  Wrist *4.2 *5.6 1.4 15.3 *6.2 59.5 Wrist Palm     Palm 1.8 *2.1 0.3 12.9 4.0 69.0       Radial Anti Sensory (Base 1st Digit)  32.6C  Wrist  2.0   21.5  Wrist Base 1st Digit     Ulnar Anti Sensory (5th Digit)  32.8C  Wrist  2.8   24.0  Wrist 5th Digit  50    Motor Left/Right Comparison   Stim Site L Lat (ms) R Lat (ms) L-R Lat (ms) L Amp (mV) R Amp (mV) L-R Amp (%) Site1 Site2 L Vel (m/s) R Vel  (m/s) L-R Vel (m/s)  Median Motor (Abd Poll Brev)  32.6C  Wrist *4.3 *4.8 0.5 6.0 7.1 15.5 Elbow Wrist *47 *47 0  Elbow 7.8 8.4 0.6 6.0 7.1 15.5       Ulnar Motor (Abd Dig Min)  32.3C  Wrist  2.8   8.6  B Elbow Wrist  66   B Elbow  5.3   7.9  A Elbow B Elbow  71   A Elbow  6.7   7.5           Waveforms:

## 2017-08-27 ENCOUNTER — Encounter (INDEPENDENT_AMBULATORY_CARE_PROVIDER_SITE_OTHER): Payer: Self-pay | Admitting: Orthopaedic Surgery

## 2017-08-27 ENCOUNTER — Telehealth (INDEPENDENT_AMBULATORY_CARE_PROVIDER_SITE_OTHER): Payer: Self-pay | Admitting: Orthopaedic Surgery

## 2017-08-27 ENCOUNTER — Ambulatory Visit (INDEPENDENT_AMBULATORY_CARE_PROVIDER_SITE_OTHER): Payer: Self-pay | Admitting: Orthopaedic Surgery

## 2017-08-27 DIAGNOSIS — G5601 Carpal tunnel syndrome, right upper limb: Secondary | ICD-10-CM

## 2017-08-27 DIAGNOSIS — G5602 Carpal tunnel syndrome, left upper limb: Secondary | ICD-10-CM

## 2017-08-27 NOTE — Telephone Encounter (Signed)
Patient wants to know if you will call her about billing since she is self pay. Patients # [phone]

## 2017-08-27 NOTE — Progress Notes (Signed)
   Office Visit Note   Patient: Christy RainwaterJuni A Willcox           Date of Birth: April 17, 1975           MRN: [id number] Visit Date: 08/27/2017              Requested by: Raliegh IpGottschalk, Ashly M, DO [address] [place], [place] [id number] PCP: Raliegh IpGottschalk, Ashly M, DO   Assessment & Plan: Visit Diagnoses:  1. Right carpal tunnel syndrome   2. Left carpal tunnel syndrome     Plan: Impression is 42 year old female with moderate right carpal tunnel syndrome and mild to moderate left carpal tunnel syndrome.  After full discussion and their associated risks and benefits of treatment options patient agrees to proceed with right carpal tunnel release and left carpal tunnel injection.  We will get her scheduled in the near future.  Follow-Up Instructions: Return if symptoms worsen or fail to improve.   Orders:  No orders of the defined types were placed in this encounter.  No orders of the defined types were placed in this encounter.     Procedures: No procedures performed   Clinical Data: No additional findings.   Subjective: Chief Complaint  Patient presents with  . Right Hand - Pain    Ms. Su HiltRoberts today for nerve conduction studies.   Review of Systems   Objective: Vital Signs: There were no vitals taken for this visit.  Physical Exam  Ortho Exam Exam is stable. Specialty Comments:  No specialty comments available.  Imaging: No results found.   PMFS History: Patient Active Problem List   Diagnosis Date Noted  . Tobacco use 07/24/2017  . Anxiety 04/11/2017  . CTS (carpal tunnel syndrome) 06/29/2013   Past Medical History:  Diagnosis Date  . Anxiety   . Carpal tunnel syndrome on both sides     Family History  Problem Relation Age of Onset  . Rectal cancer Maternal Grandmother   . Colon cancer Neg Hx   . Esophageal cancer Neg Hx     Past Surgical History:  Procedure Laterality Date  . CESAREAN SECTION     x 1  . MOUTH SURGERY     wisdom teeth  . TUBAL LIGATION      Social History   Occupational History  . Occupation: runs a restarant  Tobacco Use  . Smoking status: Current Every Day Smoker    Packs/day: 1.00    Years: 25.00    Pack years: 25.00    Types: Cigarettes  . Smokeless tobacco: Never Used  . Tobacco comment: tobacco info given  Substance and Sexual Activity  . Alcohol use: No  . Drug use: No  . Sexual activity: Yes    Birth control/protection: Surgical, IUD    Comment: IUD due for removal 2020

## 2017-09-03 ENCOUNTER — Encounter: Payer: Self-pay | Admitting: Gastroenterology

## 2017-09-03 ENCOUNTER — Other Ambulatory Visit: Payer: Self-pay

## 2017-09-03 ENCOUNTER — Ambulatory Visit (AMBULATORY_SURGERY_CENTER): Payer: Self-pay | Admitting: Gastroenterology

## 2017-09-03 VITALS — BP 109/64 | HR 64 | Temp 99.3°F | Resp 16 | Ht 60.0 in | Wt 195.0 lb

## 2017-09-03 DIAGNOSIS — R1319 Other dysphagia: Secondary | ICD-10-CM

## 2017-09-03 DIAGNOSIS — K2950 Unspecified chronic gastritis without bleeding: Secondary | ICD-10-CM

## 2017-09-03 DIAGNOSIS — R1310 Dysphagia, unspecified: Secondary | ICD-10-CM

## 2017-09-03 DIAGNOSIS — K2960 Other gastritis without bleeding: Secondary | ICD-10-CM

## 2017-09-03 MED ORDER — SODIUM CHLORIDE 0.9 % IV SOLN: 500.0000 mL | Freq: Once | INTRAVENOUS | Status: DC

## 2017-09-03 NOTE — Op Note (Signed)
[facility] Patient Name: Christy Howell Procedure Date: 09/03/2017 2:05 PM MRN: [id number] Endoscopist: Sherilyn Cooter L. Myrtie Neither , MD Age: 42 Referring MD:  Date of Birth: Nov 04, 1975 Gender: Female Account #: [id number] Procedure:                Upper GI endoscopy Indications:              Esophageal dysphagia, Heartburn Medicines:                Monitored Anesthesia Care Procedure:                Pre-Anesthesia Assessment:                           - Prior to the procedure, a History and Physical                            was performed, and patient medications and                            allergies were reviewed. The patient's tolerance of                            previous anesthesia was also reviewed. The risks                            and benefits of the procedure and the sedation                            options and risks were discussed with the patient.                            All questions were answered, and informed consent                            was obtained. Prior Anticoagulants: The patient has                            taken no previous anticoagulant or antiplatelet                            agents. ASA Grade Assessment: II - A patient with                            mild systemic disease. After reviewing the risks                            and benefits, the patient was deemed in                            satisfactory condition to undergo the procedure.                           After obtaining informed consent, the endoscope was  passed under direct vision. Throughout the                            procedure, the patient's blood pressure, pulse, and                            oxygen saturations were monitored continuously. The                            Model GIF-HQ190 [id number]) scope was introduced                            through the mouth, and advanced to the second part                            of duodenum. The upper  GI endoscopy was                            accomplished without difficulty. The patient                            tolerated the procedure well. Scope In: Scope Out: Findings:                 The examined esophagus was normal. Two biopsies                            were obtained with cold forceps for histology in                            the lower third of the esophagus to look for                            evidence of GERD or EoE.                           There is no endoscopic evidence of Barrett's                            esophagus, areas of erosion or stricture in the                            entire esophagus. There was no resistance passing                            the scope through the EGJ.                           Patchy mild inflammation characterized by erosions                            and erythema was found in the gastric body and in  the gastric antrum. Biopsies were taken with a cold                            forceps for histology. (Sidney protocol).                           The cardia and gastric fundus were normal on                            retroflexion.                           The exam of the stomach was otherwise normal.                           The examined duodenum was normal. Complications:            No immediate complications. Estimated Blood Loss:     Estimated blood loss was minimal. Impression:               - Normal esophagus.                           - Gastritis. Biopsied. Possibly                            aspirin/NSAID-induced                           - Normal examined duodenum.                           - Biopsies performed in the lower third of the                            esophagus. Recommendation:           - Patient has a contact number available for                            emergencies. The signs and symptoms of potential                            delayed complications were discussed with the                             patient. Return to normal activities tomorrow.                            Written discharge instructions were provided to the                            patient.                           - Resume previous diet.                           -  Continue present medications.                           - Await pathology results. Based on those results,                            consider high dose PPI. If ongoing dysphagia                            despite that, esophageal manometry study. Pernell Dikes L. Myrtie Neitheranis, MD 09/03/2017 2:26:52 PM This report has been signed electronically.

## 2017-09-03 NOTE — Progress Notes (Signed)
To PACU, VSS. Report to RN.tb 

## 2017-09-03 NOTE — Patient Instructions (Signed)
YOU HAD AN ENDOSCOPIC PROCEDURE TODAY AT THE [facility]:   Refer to the procedure report that was given to you for any specific questions about what was found during the examination.  If the procedure report does not answer your questions, please call your gastroenterologist to clarify.  If you requested that your care partner not be given the details of your procedure findings, then the procedure report has been included in a sealed envelope for you to review at your convenience later.  YOU SHOULD EXPECT: Some feelings of bloating in the abdomen. Passage of more gas than usual.  Walking can help get rid of the air that was put into your GI tract during the procedure and reduce the bloating. If you had a lower endoscopy (such as a colonoscopy or flexible sigmoidoscopy) you may notice spotting of blood in your stool or on the toilet paper. If you underwent a bowel prep for your procedure, you may not have a normal bowel movement for a few days.  Please Note:  You might notice some irritation and congestion in your nose or some drainage.  This is from the oxygen used during your procedure.  There is no need for concern and it should clear up in a day or so.  SYMPTOMS TO REPORT IMMEDIATELY:   Following upper endoscopy (EGD)  Vomiting of blood or coffee ground material  New chest pain or pain under the shoulder blades  Painful or persistently difficult swallowing  New shortness of breath  Fever of 100F or higher  Black, tarry-looking stools  For urgent or emergent issues, a gastroenterologist can be reached at any hour by calling [phone].   DIET:  We do recommend a small meal at first, but then you may proceed to your regular diet.  Drink plenty of fluids but you should avoid alcoholic beverages for 24 hours.  ACTIVITY:  You should plan to take it easy for the rest of today and you should NOT DRIVE or use heavy machinery until tomorrow (because of the sedation medicines used  during the test).    FOLLOW UP: Our staff will call the number listed on your records the next business day following your procedure to check on you and address any questions or concerns that you may have regarding the information given to you following your procedure. If we do not reach you, we will leave a message.  However, if you are feeling well and you are not experiencing any problems, there is no need to return our call.  We will assume that you have returned to your regular daily activities without incident.  If any biopsies were taken you will be contacted by phone or by letter within the next 1-3 weeks.  Please call us at [phone] if you have not heard about the biopsies in 3 weeks.  Await for biopsy results  SIGNATURES/CONFIDENTIALITY: You and/or your care partner have signed paperwork which will be entered into your electronic medical record.  These signatures attest to the fact that that the information above on your After Visit Summary has been reviewed and is understood.  Full responsibility of the confidentiality of this discharge information lies with you and/or your care-partner. 

## 2017-09-03 NOTE — Progress Notes (Signed)
Called to room to assist during endoscopic procedure.  Patient ID and intended procedure confirmed with present staff. Received instructions for my participation in the procedure from the performing physician.  

## 2017-09-04 ENCOUNTER — Telehealth: Payer: Self-pay | Admitting: *Deleted

## 2017-09-04 NOTE — Telephone Encounter (Signed)
No answer, left message to call if questions or concerns. 

## 2017-09-10 ENCOUNTER — Encounter (HOSPITAL_COMMUNITY): Payer: Self-pay | Admitting: Emergency Medicine

## 2017-09-10 ENCOUNTER — Emergency Department (HOSPITAL_COMMUNITY)
Admission: EM | Admit: 2017-09-10 | Discharge: 2017-09-11 | Disposition: A | Discharge status: Home / Self Care | Payer: Self-pay | Attending: Emergency Medicine | Admitting: Emergency Medicine

## 2017-09-10 ENCOUNTER — Other Ambulatory Visit: Payer: Self-pay

## 2017-09-10 ENCOUNTER — Emergency Department (HOSPITAL_COMMUNITY): Payer: Self-pay

## 2017-09-10 DIAGNOSIS — E876 Hypokalemia: Secondary | ICD-10-CM | POA: Insufficient documentation

## 2017-09-10 DIAGNOSIS — F410 Panic disorder [episodic paroxysmal anxiety] without agoraphobia: Secondary | ICD-10-CM | POA: Insufficient documentation

## 2017-09-10 DIAGNOSIS — F17210 Nicotine dependence, cigarettes, uncomplicated: Secondary | ICD-10-CM | POA: Insufficient documentation

## 2017-09-10 DIAGNOSIS — Z79899 Other long term (current) drug therapy: Secondary | ICD-10-CM | POA: Insufficient documentation

## 2017-09-10 LAB — CBC WITH DIFFERENTIAL/PLATELET
Basophils Absolute: 0 10*3/uL (ref 0.0–0.1)
Basophils Relative: 0 %
EOS PCT: 1 %
Eosinophils Absolute: 0.1 10*3/uL (ref 0.0–0.7)
HEMATOCRIT: 42.3 % (ref 36.0–46.0)
HEMOGLOBIN: 14.4 g/dL (ref 12.0–15.0)
LYMPHS ABS: 2.1 10*3/uL (ref 0.7–4.0)
LYMPHS PCT: 19 %
MCH: 30.4 pg (ref 26.0–34.0)
MCHC: 34 g/dL (ref 30.0–36.0)
MCV: 89.2 fL (ref 78.0–100.0)
Monocytes Absolute: 0.9 10*3/uL (ref 0.1–1.0)
Monocytes Relative: 8 %
NEUTROS PCT: 72 %
Neutro Abs: 8.2 10*3/uL — ABNORMAL HIGH (ref 1.7–7.7)
Platelets: 235 10*3/uL (ref 150–400)
RBC: 4.74 MIL/uL (ref 3.87–5.11)
RDW: 11.9 % (ref 11.5–15.5)
WBC: 11.4 10*3/uL — AB (ref 4.0–10.5)

## 2017-09-10 NOTE — ED Triage Notes (Signed)
Pt reports she was at work [employer] and arguing with her daughter when she began feeling like she could not "get enough air", felt dizzy, V/, and had a facial spasm. Pt only has SOB at this time. Hx of anxiety, states this felt worse than her normal anxiety attacks.

## 2017-09-11 LAB — BASIC METABOLIC PANEL
ANION GAP: 10 (ref 5–15)
BUN: 8 mg/dL (ref 6–20)
CALCIUM: 9 mg/dL (ref 8.9–10.3)
CO2: 25 mmol/L (ref 22–32)
CREATININE: 0.65 mg/dL (ref 0.44–1.00)
Chloride: 102 mmol/L (ref 98–111)
GFR calc non Af Amer: >60 mL/min (ref >60)
Glucose, Bld: 91 mg/dL (ref 70–99)
Potassium: 3.2 mmol/L — ABNORMAL LOW (ref 3.5–5.1)
SODIUM: 137 mmol/L (ref 135–145)

## 2017-09-11 LAB — TROPONIN I

## 2017-09-11 MED ORDER — LORAZEPAM 1 MG PO TABS
1.0000 mg | ORAL_TABLET | Freq: Once | ORAL | Status: AC
  Administered 2017-09-11: 1 mg via ORAL
  Filled 2017-09-11: qty 1

## 2017-09-11 MED ORDER — LORAZEPAM 0.5 MG PO TABS: 0.5000 mg | ORAL_TABLET | Freq: Four times a day (QID) | ORAL | 0 refills | Status: DC | PRN

## 2017-09-11 NOTE — Discharge Instructions (Addendum)
Your electrocardiogram, chest x-ray, and heart enzymes are all negative.  Your chemistries show that your potassium is slightly low at 3.1.  Please increase foods high in potassium such as leafy green vegetables, potatoes, tomatoes, etc.  I am enclosing the potassium content of foods in your discharge instructions.  Please use Ativan every 6 hours as needed for anxiety.  Please call Dr.Gottschalk for an appointment in the office to discuss anxiety and possible panic situations.

## 2017-09-11 NOTE — ED Provider Notes (Signed)
[facility] EMERGENCY DEPARTMENT Provider Note   CSN: [id number] Arrival date & time: 09/10/17  2117     History   Chief Complaint Chief Complaint  Patient presents with  . Shortness of Breath    HPI Christy Howell is a 42 y.o. female.  Patient is a 42 year old female who presents to the emergency department with a complaint of shortness of breath.  The patient states that she was at work earlier [employer].  She got in the argument with her daughter, and she began to feel like she could not get enough air.  She complains of feeling dizzy, lightheaded, and eventually had tingling of her hands and face and then some facial spasm.  The patient states that she has had some problems with anxiety in the past, but has never had any symptoms of this type.  She says the symptoms are beginning to improve after her arrival in the emergency department.  The patient complains of sensation of shortness of breath, but no actual chest pain.  There was no loss of consciousness reported.  There was no loss of memory or change in the patient's general personality.  She did not lose control of bowels or bladder.  She did not lose control of upper or lower extremities.  She presents now for assistance with this issue.     Past Medical History:  Diagnosis Date  . Anxiety   . Arthritis   . Carpal tunnel syndrome on both sides   . GERD (gastroesophageal reflux disease)     Patient Active Problem List   Diagnosis Date Noted  . Tobacco use 07/24/2017  . Anxiety 04/11/2017  . CTS (carpal tunnel syndrome) 06/29/2013    Past Surgical History:  Procedure Laterality Date  . CESAREAN SECTION     x 1  . MOUTH SURGERY     wisdom teeth  . TUBAL LIGATION       OB History   None      Home Medications    Prior to Admission medications   Medication Sig Start Date End Date Taking? Authorizing Provider  Aspirin-Acetaminophen-Caffeine (GOODY HEADACHE PO) Take 1 packet by mouth daily as needed (for  pain).    [provider]  levonorgestrel (MIRENA, 52 MG,) 20 MCG/24HR IUD 20 mcg by Intrauterine route once.    [provider]  LORazepam (ATIVAN) 0.5 MG tablet Take 1 tablet (0.5 mg total) by mouth every 6 (six) hours as needed for anxiety. 09/11/17   Ivery Quale, PA-C  meloxicam (MOBIC) 7.5 MG tablet Take 1 tablet (7.5 mg total) by mouth 2 (two) times daily as needed for pain. 07/31/17   Tarry Kos, MD  PARoxetine (PAXIL) 20 MG tablet Take 20 mg by mouth daily.    [provider]    Family History Family History  Problem Relation Age of Onset  . Rectal cancer Maternal Grandmother   . Colon cancer Neg Hx   . Esophageal cancer Neg Hx     Social History Social History   Tobacco Use  . Smoking status: Current Every Day Smoker    Packs/day: 1.00    Years: 25.00    Pack years: 25.00    Types: Cigarettes  . Smokeless tobacco: Never Used  . Tobacco comment: tobacco info given  Substance Use Topics  . Alcohol use: No  . Drug use: No     Allergies   Kiwi extract; Strawberry extract; and Penicillins   Review of Systems Review of Systems  Constitutional: Negative for activity change.       All ROS Neg except as noted in HPI  HENT: Negative for nosebleeds.   Eyes: Negative for photophobia and discharge.  Respiratory: Positive for shortness of breath. Negative for cough and wheezing.   Cardiovascular: Negative for chest pain and palpitations.  Gastrointestinal: Negative for abdominal pain and blood in stool.  Genitourinary: Negative for dysuria, frequency and hematuria.  Musculoskeletal: Negative for arthralgias, back pain and neck pain.  Skin: Negative.   Neurological: Positive for dizziness and light-headedness. Negative for seizures and speech difficulty.       Tingling of extremities and face  Psychiatric/Behavioral: Negative for confusion and hallucinations. The patient is nervous/anxious.      Physical Exam Updated Vital Signs BP  133/79 (BP Location: Right Arm)   Pulse 75   Temp 98.4 F (36.9 C) (Oral)   Resp 17   Ht 5' (1.524 m)   Wt 86.2 kg (190 lb)   SpO2 100%   BMI 37.11 kg/m   Physical Exam  Constitutional: She appears well-developed and well-nourished. No distress.  HENT:  Head: Normocephalic and atraumatic.  Right Ear: External ear normal.  Left Ear: External ear normal.  Eyes: Conjunctivae are normal. Right eye exhibits no discharge. Left eye exhibits no discharge. No scleral icterus.  Neck: Neck supple. No tracheal deviation present.  Cardiovascular: Normal rate, regular rhythm and intact distal pulses.  Pulmonary/Chest: Effort normal and breath sounds normal. No stridor. No respiratory distress. She has no wheezes. She has no rales.  Abdominal: Soft. Bowel sounds are normal. She exhibits no distension. There is no tenderness. There is no rebound and no guarding.  Musculoskeletal: She exhibits no edema or tenderness.  Neurological: She is alert. She has normal strength. No cranial nerve deficit (no facial droop, extraocular movements intact, no slurred speech) or sensory deficit. She exhibits normal muscle tone. She displays no seizure activity. Coordination normal.  Skin: Skin is warm and dry. No rash noted.  Psychiatric: Judgment normal. Her mood appears anxious. Cognition and memory are normal. She expresses no homicidal and no suicidal ideation. She expresses no suicidal plans and no homicidal plans.  Nursing note and vitals reviewed.    ED Treatments / Results  Labs (all labs ordered are listed, but only abnormal results are displayed) Labs Reviewed  CBC WITH DIFFERENTIAL/PLATELET - Abnormal; Notable for the following components:      Result Value   WBC 11.4 (*)    Neutro Abs 8.2 (*)    All other components within normal limits  BASIC METABOLIC PANEL - Abnormal; Notable for the following components:   Potassium 3.2 (*)    All other components within normal limits  TROPONIN I     EKG EKG Interpretation  Date/Time:  Tuesday September 10 2017 21:26:32 EDT Ventricular Rate:  78 PR Interval:  136 QRS Duration: 80 QT Interval:  378 QTC Calculation: 430 R Axis:   56 Text Interpretation:  Normal sinus rhythm Normal ECG No previous ECGs available Confirmed by Paula LibraMolpus, John ([id number]) on 09/11/2017 9:21:54 AM   Radiology Dg Chest 2 View  Result Date: 09/10/2017 CLINICAL DATA:  Shortness of breath EXAM: CHEST - 2 VIEW COMPARISON:  None. FINDINGS: Normal heart size and mediastinal contours. No acute infiltrate or edema. No effusion or pneumothorax. No acute osseous findings. IMPRESSION: Negative chest. Electronically Signed   By: Marnee SpringJonathon  Watts M.D.   On: 09/10/2017 22:16    Procedures Procedures (including critical care time)  Medications Ordered  in ED Medications  LORazepam (ATIVAN) tablet 1 mg (1 mg Oral Given 09/11/17 0049)     Initial Impression / Assessment and Plan / ED Course  I have reviewed the triage vital signs and the nursing notes.  Pertinent labs & imaging results that were available during my care of the patient were reviewed by me and considered in my medical decision making (see chart for details).      Final Clinical Impressions(s) / ED Diagnoses MDM Patient seen with me by Dr. Adriana Simas.  Patient states she is beginning to feel some better, but still anxious.  The white blood cell count was slightly elevated at 11.4, otherwise the complete blood count is within normal limits.  The basic metabolic panel shows the potassium to be low at 3.2, otherwise within normal limits.  Troponin is less than 0.03.  The electrocardiogram shows a normal sinus rhythm.  There is no life-threatening arrhythmias, no STEMI noted.  I discussed the findings with the patient in terms which she understands.  I discussed with her that the symptoms are most likely related to a panic attack of some form.  The patient will be given 6 tablets of Ativan 0.5 mg to use public and  appointment with Dr Nadine Counts.patient denies suicidal or homicidal ideations.  Patient will be with family members tonight, and she agrees to return to the emergency department if any changes in her condition, problems, or concerns.   Final diagnoses:  Panic attack  Hypokalemia    ED Discharge Orders        Ordered    LORazepam (ATIVAN) 0.5 MG tablet  Every 6 hours PRN     09/11/17 0034       Ivery Quale, PA-C 09/11/17 1158    Donnetta Hutching, MD 09/11/17 1306

## 2017-09-13 ENCOUNTER — Ambulatory Visit (INDEPENDENT_AMBULATORY_CARE_PROVIDER_SITE_OTHER): Payer: Self-pay | Admitting: Family Medicine

## 2017-09-13 ENCOUNTER — Encounter: Payer: Self-pay | Admitting: Family Medicine

## 2017-09-13 ENCOUNTER — Other Ambulatory Visit: Payer: Self-pay

## 2017-09-13 VITALS — BP 124/84 | HR 82 | Temp 97.2°F | Ht 60.0 in | Wt 192.0 lb

## 2017-09-13 DIAGNOSIS — F411 Generalized anxiety disorder: Secondary | ICD-10-CM

## 2017-09-13 DIAGNOSIS — F410 Panic disorder [episodic paroxysmal anxiety] without agoraphobia: Secondary | ICD-10-CM

## 2017-09-13 MED ORDER — OMEPRAZOLE 40 MG PO CPDR: 40.0000 mg | DELAYED_RELEASE_CAPSULE | Freq: Every day | ORAL | 1 refills | Status: DC

## 2017-09-13 MED ORDER — PAROXETINE HCL 40 MG PO TABS: 40.0000 mg | ORAL_TABLET | Freq: Every day | ORAL | 0 refills | Status: DC

## 2017-09-13 NOTE — Patient Instructions (Signed)
I have increased her dose of Paxil to 40 mg daily.  You may double up on the 20 mg tablets until you finish your current bottle.  We discussed that the Ativan is used for panic attack.  This may cause sedation.  If you find that you continue to have symptoms despite increased dose of Paxil, we may need to consider continuing the Ativan for as needed use.  Follow-up with me in 6 weeks or sooner if needed.  Taking the medicine as directed and not missing any doses is one of the best things you can do to treat your depression/anxiety/ panic.  Here are some things to keep in mind:  1) Side effects (stomach upset, some increased anxiety) may happen before you notice a benefit.  These side effects typically go away over time. 2) Changes to your dose of medicine or a change in medication all together is sometimes necessary 3) Most people need to be on medication at least 12 months 4) Many people will notice an improvement within two weeks but the full effect of the medication can take up to 4-6 weeks 5) Stopping the medication when you start feeling better often results in a return of symptoms 6) Never discontinue your medication without contacting a health care professional first.  Some medications require gradual discontinuation/ taper and can make you sick if you stop them abruptly.  If your symptoms worsen or you have thoughts of suicide/homicide, PLEASE SEEK IMMEDIATE MEDICAL ATTENTION.  You may always call:  National Suicide Hotline: 408 671 02153853441115 [place] Crisis Line: [phone]) 089-1617 [place]: 410 [phone]   These are available 24 hours a day, 7 days a week.

## 2017-09-13 NOTE — Progress Notes (Signed)
Subjective: CC: ED follow up panic attack PCP: Raliegh IpGottschalk, Ashly M, DO ZOX:WRUEHPI:Christy Howell is a 42 y.o. female presenting to clinic today for:  1. ED follow up for panic Patient was seen in the emergency department on 09/10/2017 for panic attack.  She was prescribed a small quantity of Ativan.  She notes that she never took the Ativan because she did not know what it was for.  She has continued taking the Paxil as directed.  She reports that she has panic symptoms more now that she is got multiple stressors at home.  She cites that her teenage daughter is now pregnant, this is the daughter that has mental health disorder and is actually now off of medications since finding out that she was pregnant.  She notes that her child's mental health has worsened since being off of medications and she refuses to go back on them.  She also reports that likely, she will be the one to care for the infant, as her daughter would not be able to.  She also has custody of another teenage boy whose mother is currently in drug rehab.  She notes being overwhelmed.  Denies any SI, HI.    ROS: Per HPI  Allergies  Allergen Reactions  . Kiwi Extract     Throat itches and hives  . Strawberry Extract Hives    blistering  . Penicillins Rash   Past Medical History:  Diagnosis Date  . Anxiety   . Arthritis   . Carpal tunnel syndrome on both sides   . GERD (gastroesophageal reflux disease)     Current Outpatient Medications:  .  Aspirin-Acetaminophen-Caffeine (GOODY HEADACHE PO), Take 1 packet by mouth daily as needed (for pain)., Disp: , Rfl:  .  levonorgestrel (MIRENA, 52 MG,) 20 MCG/24HR IUD, 20 mcg by Intrauterine route once., Disp: , Rfl:  .  LORazepam (ATIVAN) 0.5 MG tablet, Take 1 tablet (0.5 mg total) by mouth every 6 (six) hours as needed for anxiety., Disp: 6 tablet, Rfl: 0 .  meloxicam (MOBIC) 7.5 MG tablet, Take 1 tablet (7.5 mg total) by mouth 2 (two) times daily as needed for pain. (Patient taking  differently: Take 7.5 mg by mouth daily. ), Disp: 30 tablet, Rfl: 2 .  omeprazole (PRILOSEC) 40 MG capsule, Take 1 capsule (40 mg total) by mouth daily., Disp: 30 capsule, Rfl: 1 .  PARoxetine (PAXIL) 20 MG tablet, Take 20 mg by mouth daily., Disp: , Rfl:   Current Facility-Administered Medications:  .  0.9 %  sodium chloride infusion, 500 mL, Intravenous, Once, Danis, Andreas BlowerHenry L III, MD Social History   Socioeconomic History  . Marital status: Married    Spouse name: Not on file  . Number of children: 3  . Years of education: Not on file  . Highest education level: Not on file  Occupational History  . Occupation: runs a Comptrollerrestarant  Social Needs  . Financial resource strain: Not on file  . Food insecurity:    Worry: Not on file    Inability: Not on file  . Transportation needs:    Medical: Not on file    Non-medical: Not on file  Tobacco Use  . Smoking status: Current Every Day Smoker    Packs/day: 1.00    Years: 25.00    Pack years: 25.00    Types: Cigarettes  . Smokeless tobacco: Never Used  . Tobacco comment: tobacco info given  Substance and Sexual Activity  . Alcohol use: No  .  Drug use: No  . Sexual activity: Yes    Birth control/protection: Surgical, IUD    Comment: IUD due for removal 2020  Lifestyle  . Physical activity:    Days per week: Not on file    Minutes per session: Not on file  . Stress: Not on file  Relationships  . Social connections:    Talks on phone: Not on file    Gets together: Not on file    Attends religious service: Not on file    Active member of club or organization: Not on file    Attends meetings of clubs or organizations: Not on file    Relationship status: Not on file  . Intimate partner violence:    Fear of current or ex partner: Not on file    Emotionally abused: Not on file    Physically abused: Not on file    Forced sexual activity: Not on file  Other Topics Concern  . Not on file  Social History Narrative   Married, 3  children   Right handed   12 th grade   4-5 16 oz diet sodas daily   Family History  Problem Relation Age of Onset  . Rectal cancer Maternal Grandmother   . Colon cancer Neg Hx   . Esophageal cancer Neg Hx     Objective: Office vital signs reviewed. BP 124/84   Pulse 82   Temp (!) 97.2 F (36.2 C) (Oral)   Ht 5' (1.524 m)   Wt 192 lb (87.1 kg)   BMI 37.50 kg/m   Physical Examination:  General: Awake, alert, well nourished, No acute distress HEENT: Normal    Eyes: PERRLA, extraocular membranes intact, sclera white Cardio: regular rate and rhythm, S1S2 heard, no murmurs appreciated Pulm: clear to auscultation bilaterally, no wheezes, rhonchi or rales; normal work of breathing on room air Extremities: warm, well perfused, No edema, cyanosis or clubbing; +2 pulses bilaterally Psych: Mood stable, speech normal, affect appropriate, good eye contact, does not appear to be responding to internal stimuli.  She does appear stressed. Depression screen [facility] 2/9 09/13/2017 07/24/2017  Decreased Interest 1 1  Down, Depressed, Hopeless 1 1  PHQ - 2 Score 2 2  Altered sleeping 2 3  Tired, decreased energy 2 3  Change in appetite 1 1  Feeling bad or failure about yourself  0 1  Trouble concentrating 1 1  Moving slowly or fidgety/restless 0 0  Suicidal thoughts 0 0  PHQ-9 Score 8 11  Difficult doing work/chores Not difficult at all -   GAD 7 : Generalized Anxiety Score 09/13/2017 07/24/2017  Nervous, Anxious, on Edge 3 1  Control/stop worrying 3 1  Worry too much - different things 2 1  Trouble relaxing 2 2  Restless 2 3  Easily annoyed or irritable 2 1  Afraid - awful might happen - 0  Total GAD 7 Score - 9  Anxiety Difficulty Somewhat difficult -    Assessment/ Plan: 42 y.o. female   1. Generalized anxiety disorder with panic attacks Having panic attacks and physical manifestations of anxiety.  We discussed options including increasing Paxil to 40 mg daily.  Patient did wish  to go ahead and proceed with increasing the medication.  She would like to avoid sedating medications if possible.  She did get the Ativan refilled yesterday to use if she starts having severe symptoms like what brought her to the hospital.  However, she has not yet used this.  I did  caution sedation with that medication.  If she continues to have symptoms despite increased dose of Paxil, I would be willing to prescribe the benzodiazepine.  However, I do agree that sedating medications would not be ideal given possibly need to care for a new infant, I think that the patient is responsible trustworthy and would not use this inappropriately.  The national suicide hotline, [facility] crisis hotline and [place] crisis hotline were provided to the patient.  She will follow-up with me in 4 to 6 weeks or sooner if needed.  Meds ordered this encounter  Medications  . PARoxetine (PAXIL) 40 MG tablet    Sig: Take 1 tablet (40 mg total) by mouth daily.    Dispense:  90 tablet    Refill:  0     Ashly Hulen Skains, DO [facility] [phone]

## 2017-09-20 ENCOUNTER — Encounter (HOSPITAL_BASED_OUTPATIENT_CLINIC_OR_DEPARTMENT_OTHER): Payer: Self-pay | Admitting: *Deleted

## 2017-09-20 ENCOUNTER — Other Ambulatory Visit: Payer: Self-pay

## 2017-09-25 ENCOUNTER — Ambulatory Visit (HOSPITAL_BASED_OUTPATIENT_CLINIC_OR_DEPARTMENT_OTHER): Payer: Self-pay | Admitting: Anesthesiology

## 2017-09-25 ENCOUNTER — Other Ambulatory Visit: Payer: Self-pay

## 2017-09-25 ENCOUNTER — Encounter (HOSPITAL_BASED_OUTPATIENT_CLINIC_OR_DEPARTMENT_OTHER)
Admission: RE | Disposition: A | Discharge status: Home / Self Care | Payer: Self-pay | Source: Ambulatory Visit | Attending: Orthopaedic Surgery

## 2017-09-25 ENCOUNTER — Ambulatory Visit (HOSPITAL_BASED_OUTPATIENT_CLINIC_OR_DEPARTMENT_OTHER)
Admission: RE | Admit: 2017-09-25 | Discharge: 2017-09-25 | Disposition: A | Discharge status: Home / Self Care | Payer: Self-pay | Source: Ambulatory Visit | Attending: Orthopaedic Surgery | Admitting: Orthopaedic Surgery

## 2017-09-25 ENCOUNTER — Encounter (HOSPITAL_BASED_OUTPATIENT_CLINIC_OR_DEPARTMENT_OTHER): Payer: Self-pay | Admitting: Anesthesiology

## 2017-09-25 DIAGNOSIS — F419 Anxiety disorder, unspecified: Secondary | ICD-10-CM | POA: Insufficient documentation

## 2017-09-25 DIAGNOSIS — G5602 Carpal tunnel syndrome, left upper limb: Secondary | ICD-10-CM

## 2017-09-25 DIAGNOSIS — F17210 Nicotine dependence, cigarettes, uncomplicated: Secondary | ICD-10-CM | POA: Insufficient documentation

## 2017-09-25 DIAGNOSIS — G5601 Carpal tunnel syndrome, right upper limb: Secondary | ICD-10-CM

## 2017-09-25 DIAGNOSIS — K219 Gastro-esophageal reflux disease without esophagitis: Secondary | ICD-10-CM | POA: Insufficient documentation

## 2017-09-25 DIAGNOSIS — G5603 Carpal tunnel syndrome, bilateral upper limbs: Secondary | ICD-10-CM | POA: Insufficient documentation

## 2017-09-25 HISTORY — PX: CARPAL TUNNEL RELEASE: SHX101

## 2017-09-25 HISTORY — PX: STERIOD INJECTION: SHX5046

## 2017-09-25 SURGERY — CARPAL TUNNEL RELEASE
Anesthesia: Monitor Anesthesia Care | Site: Wrist | Laterality: Right

## 2017-09-25 MED ORDER — LIDOCAINE HCL (PF) 1 % IJ SOLN
INTRAMUSCULAR | Status: AC
  Filled 2017-09-25: qty 30

## 2017-09-25 MED ORDER — PROPOFOL 10 MG/ML IV BOLUS
INTRAVENOUS | Status: DC | PRN
  Administered 2017-09-25: 200 mg via INTRAVENOUS

## 2017-09-25 MED ORDER — LACTATED RINGERS IV SOLN: INTRAVENOUS | Status: DC

## 2017-09-25 MED ORDER — FENTANYL CITRATE (PF) 100 MCG/2ML IJ SOLN: 50.0000 ug | INTRAMUSCULAR | Status: DC | PRN

## 2017-09-25 MED ORDER — MIDAZOLAM HCL 5 MG/5ML IJ SOLN
INTRAMUSCULAR | Status: DC | PRN
  Administered 2017-09-25: 1 mg via INTRAVENOUS

## 2017-09-25 MED ORDER — BUPIVACAINE HCL (PF) 0.25 % IJ SOLN
INTRAMUSCULAR | Status: DC | PRN
  Administered 2017-09-25: 10 mL

## 2017-09-25 MED ORDER — SCOPOLAMINE 1 MG/3DAYS TD PT72: 1.0000 | MEDICATED_PATCH | Freq: Once | TRANSDERMAL | Status: DC | PRN

## 2017-09-25 MED ORDER — CHLORHEXIDINE GLUCONATE 4 % EX LIQD: 60.0000 mL | Freq: Once | CUTANEOUS | Status: DC

## 2017-09-25 MED ORDER — PROPOFOL 500 MG/50ML IV EMUL
INTRAVENOUS | Status: AC
  Filled 2017-09-25: qty 50

## 2017-09-25 MED ORDER — CEFAZOLIN SODIUM-DEXTROSE 2-4 GM/100ML-% IV SOLN: 2.0000 g | INTRAVENOUS | Status: DC

## 2017-09-25 MED ORDER — PROMETHAZINE HCL 25 MG/ML IJ SOLN: 6.2500 mg | INTRAMUSCULAR | Status: DC | PRN

## 2017-09-25 MED ORDER — MIDAZOLAM HCL 2 MG/2ML IJ SOLN: 1.0000 mg | INTRAMUSCULAR | Status: DC | PRN

## 2017-09-25 MED ORDER — METHYLPREDNISOLONE ACETATE 80 MG/ML IJ SUSP
INTRAMUSCULAR | Status: AC
  Filled 2017-09-25: qty 1

## 2017-09-25 MED ORDER — FENTANYL CITRATE (PF) 100 MCG/2ML IJ SOLN
INTRAMUSCULAR | Status: AC
  Filled 2017-09-25: qty 2

## 2017-09-25 MED ORDER — LIDOCAINE HCL (PF) 1 % IJ SOLN
INTRAMUSCULAR | Status: DC | PRN
  Administered 2017-09-25: 1 mL

## 2017-09-25 MED ORDER — LACTATED RINGERS IV SOLN
INTRAVENOUS | Status: DC | PRN
  Administered 2017-09-25: 09:00:00 via INTRAVENOUS

## 2017-09-25 MED ORDER — METHYLPREDNISOLONE ACETATE 40 MG/ML IJ SUSP
INTRAMUSCULAR | Status: AC
Start: 2017-09-25 — End: ?
  Filled 2017-09-25: qty 1

## 2017-09-25 MED ORDER — FENTANYL CITRATE (PF) 100 MCG/2ML IJ SOLN
INTRAMUSCULAR | Status: DC | PRN
  Administered 2017-09-25: 50 ug via INTRAVENOUS

## 2017-09-25 MED ORDER — ONDANSETRON HCL 4 MG/2ML IJ SOLN
INTRAMUSCULAR | Status: AC
  Filled 2017-09-25: qty 2

## 2017-09-25 MED ORDER — DEXAMETHASONE SODIUM PHOSPHATE 10 MG/ML IJ SOLN
INTRAMUSCULAR | Status: AC
  Filled 2017-09-25: qty 1

## 2017-09-25 MED ORDER — LIDOCAINE 2% (20 MG/ML) 5 ML SYRINGE
INTRAMUSCULAR | Status: DC | PRN
  Administered 2017-09-25: 80 mg via INTRAVENOUS

## 2017-09-25 MED ORDER — BUPIVACAINE HCL (PF) 0.25 % IJ SOLN
INTRAMUSCULAR | Status: DC | PRN
  Administered 2017-09-25: 1 mL

## 2017-09-25 MED ORDER — HYDROCODONE-ACETAMINOPHEN 5-325 MG PO TABS: 1.0000 | ORAL_TABLET | Freq: Four times a day (QID) | ORAL | 0 refills | Status: DC | PRN

## 2017-09-25 MED ORDER — METHYLPREDNISOLONE ACETATE 40 MG/ML IJ SUSP
INTRAMUSCULAR | Status: DC | PRN
  Administered 2017-09-25: 10 mg

## 2017-09-25 MED ORDER — ONDANSETRON HCL 4 MG/2ML IJ SOLN
INTRAMUSCULAR | Status: DC | PRN
  Administered 2017-09-25: 4 mg via INTRAVENOUS

## 2017-09-25 MED ORDER — MIDAZOLAM HCL 2 MG/2ML IJ SOLN
INTRAMUSCULAR | Status: AC
  Filled 2017-09-25: qty 2

## 2017-09-25 MED ORDER — CEFAZOLIN SODIUM-DEXTROSE 2-4 GM/100ML-% IV SOLN
INTRAVENOUS | Status: AC
  Filled 2017-09-25: qty 100

## 2017-09-25 MED ORDER — FENTANYL CITRATE (PF) 100 MCG/2ML IJ SOLN: 25.0000 ug | INTRAMUSCULAR | Status: DC | PRN

## 2017-09-25 MED ORDER — PROMETHAZINE HCL 25 MG PO TABS: 25.0000 mg | ORAL_TABLET | Freq: Four times a day (QID) | ORAL | 1 refills | Status: DC | PRN

## 2017-09-25 MED ORDER — BUPIVACAINE HCL (PF) 0.25 % IJ SOLN
INTRAMUSCULAR | Status: AC
  Filled 2017-09-25: qty 30

## 2017-09-25 MED ORDER — LIDOCAINE HCL (CARDIAC) PF 100 MG/5ML IV SOSY
PREFILLED_SYRINGE | INTRAVENOUS | Status: AC
  Filled 2017-09-25: qty 5

## 2017-09-25 SURGICAL SUPPLY — 55 items
BANDAGE ACE 3X5.8 VEL STRL LF (GAUZE/BANDAGES/DRESSINGS) ×4 IMPLANT
BANDAGE ADH SHEER 1  50/CT (GAUZE/BANDAGES/DRESSINGS) ×4 IMPLANT
BLADE MINI RND TIP GREEN BEAV (BLADE) ×4 IMPLANT
BLADE SURG 15 STRL LF DISP TIS (BLADE) ×2 IMPLANT
BLADE SURG 15 STRL SS (BLADE) ×2
BNDG ESMARK 4X9 LF (GAUZE/BANDAGES/DRESSINGS) ×4 IMPLANT
BNDG PLASTER X FAST 3X3 WHT LF (CAST SUPPLIES) ×40 IMPLANT
BRUSH SCRUB EZ PLAIN DRY (MISCELLANEOUS) ×4 IMPLANT
CANISTER SUCT 1200ML W/VALVE (MISCELLANEOUS) IMPLANT
CORD BIPOLAR FORCEPS 12FT (ELECTRODE) ×4 IMPLANT
COVER BACK TABLE 60X90IN (DRAPES) ×4 IMPLANT
COVER MAYO STAND STRL (DRAPES) ×4 IMPLANT
CUFF TOURNIQUET SINGLE 18IN (TOURNIQUET CUFF) ×4 IMPLANT
DECANTER SPIKE VIAL GLASS SM (MISCELLANEOUS) IMPLANT
DRAPE EXTREMITY T 121X128X90 (DRAPE) ×4 IMPLANT
DRAPE SURG 17X23 STRL (DRAPES) ×4 IMPLANT
GAUZE SPONGE 4X4 12PLY STRL (GAUZE/BANDAGES/DRESSINGS) ×4 IMPLANT
GAUZE SPONGE 4X4 16PLY XRAY LF (GAUZE/BANDAGES/DRESSINGS) IMPLANT
GAUZE XEROFORM 1X8 LF (GAUZE/BANDAGES/DRESSINGS) ×4 IMPLANT
GLOVE BIO SURGEON STRL SZ 6.5 (GLOVE) ×6 IMPLANT
GLOVE BIO SURGEONS STRL SZ 6.5 (GLOVE) ×2
GLOVE BIOGEL PI IND STRL 7.0 (GLOVE) ×2 IMPLANT
GLOVE BIOGEL PI INDICATOR 7.0 (GLOVE) ×2
GLOVE ECLIPSE 7.0 STRL STRAW (GLOVE) ×4 IMPLANT
GLOVE SKINSENSE NS SZ7.5 (GLOVE) ×2
GLOVE SKINSENSE STRL SZ7.5 (GLOVE) ×2 IMPLANT
GLOVE SURG SYN 7.5  E (GLOVE) ×2
GLOVE SURG SYN 7.5 E (GLOVE) ×2 IMPLANT
GOWN SRG XL LVL 4 BRTHBL STRL (GOWNS) IMPLANT
GOWN STRL NON-REIN XL LVL4 (GOWNS)
GOWN STRL REIN XL XLG (GOWN DISPOSABLE) ×4 IMPLANT
GOWN STRL REUS W/ TWL XL LVL3 (GOWN DISPOSABLE) ×4 IMPLANT
GOWN STRL REUS W/TWL XL LVL3 (GOWN DISPOSABLE) ×4
NDL SAFETY ECLIPSE 18X1.5 (NEEDLE) ×2 IMPLANT
NEEDLE HYPO 18GX1.5 SHARP (NEEDLE) ×2
NEEDLE HYPO 25X1 1.5 SAFETY (NEEDLE) ×8 IMPLANT
NEEDLE PRECISIONGLIDE 27X1.5 (NEEDLE) IMPLANT
NS IRRIG 1000ML POUR BTL (IV SOLUTION) ×4 IMPLANT
PACK BASIN DAY SURGERY FS (CUSTOM PROCEDURE TRAY) ×4 IMPLANT
PAD ALCOHOL SWAB (MISCELLANEOUS) ×8 IMPLANT
PAD CAST 3X4 CTTN HI CHSV (CAST SUPPLIES) ×2 IMPLANT
PADDING CAST COTTON 3X4 STRL (CAST SUPPLIES) ×2
RUBBERBAND STERILE (MISCELLANEOUS) ×8 IMPLANT
STOCKINETTE 4X48 STRL (DRAPES) ×4 IMPLANT
SUT ETHILON 3 0 PS 1 (SUTURE) ×4 IMPLANT
SUT ETHILON 4 0 PS 2 18 (SUTURE) IMPLANT
SWABSTICK POVIDONE IODINE SNGL (MISCELLANEOUS) ×12 IMPLANT
SYR 3ML 23GX1 SAFETY (SYRINGE) ×4 IMPLANT
SYR BULB 3OZ (MISCELLANEOUS) ×4 IMPLANT
SYR CONTROL 10ML LL (SYRINGE) IMPLANT
TOWEL GREEN STERILE FF (TOWEL DISPOSABLE) ×4 IMPLANT
TRAY DSU PREP LF (CUSTOM PROCEDURE TRAY) ×4 IMPLANT
TUBE CONNECTING 20'X1/4 (TUBING) ×1
TUBE CONNECTING 20X1/4 (TUBING) ×3 IMPLANT
UNDERPAD 30X30 (UNDERPADS AND DIAPERS) ×4 IMPLANT

## 2017-09-25 NOTE — Discharge Instructions (Signed)
Postoperative instructions:  Weightbearing instructions: as tolerated but no heavy lifting  Dressing instructions: Keep your dressing and/or splint clean and dry at all times.  It will be removed at your first post-operative appointment.  Your stitches and/or staples will be removed at this visit.  Incision instructions:  Do not soak your incision for 3 weeks after surgery.  If the incision gets wet, pat dry and do not scrub the incision.  Pain control:  You have been given a prescription to be taken as directed for post-operative pain control.  In addition, elevate the operative extremity above the heart at all times to prevent swelling and throbbing pain.  Take over-the-counter Colace, 100mg  by mouth twice a day while taking narcotic pain medications to help prevent constipation.  Follow up appointments: 1) 10-14 days for suture removal and wound check. 2) Dr. Roda ShuttersXu as scheduled.   -------------------------------------------------------------------------------------------------------------  After Surgery Pain Control:  After your surgery, post-surgical discomfort or pain is likely. This discomfort can last several days to a few weeks. At certain times of the day your discomfort may be more intense.  Did you receive a nerve block?  A nerve block can provide pain relief for one hour to two days after your surgery. As long as the nerve block is working, you will experience little or no sensation in the area the surgeon operated on.  As the nerve block wears off, you will begin to experience pain or discomfort. It is very important that you begin taking your prescribed pain medication before the nerve block fully wears off. Treating your pain at the first sign of the block wearing off will ensure your pain is better controlled and more tolerable when full-sensation returns. Do not wait until the pain is intolerable, as the medicine will be less effective. It is better to treat pain in advance than  to try and catch up.  General Anesthesia:  If you did not receive a nerve block during your surgery, you will need to start taking your pain medication shortly after your surgery and should continue to do so as prescribed by your surgeon.  Pain Medication:  Most commonly we prescribe Vicodin and Percocet for post-operative pain. Both of these medications contain a combination of acetaminophen (Tylenol) and a narcotic to help control pain.   It takes between 30 and 45 minutes before pain medication starts to work. It is important to take your medication before your pain level gets too intense.   Nausea is a common side effect of many pain medications. You will want to eat something before taking your pain medicine to help prevent nausea.   If you are taking a prescription pain medication that contains acetaminophen, we recommend that you do not take additional over the counter acetaminophen (Tylenol).  Other pain relieving options:   Using a cold pack to ice the affected area a few times a day (15 to 20 minutes at a time) can help to relieve pain, reduce swelling and bruising.   Elevation of the affected area can also help to reduce pain and swelling.        Post Anesthesia Home Care Instructions  Activity: Get plenty of rest for the remainder of the day. A responsible individual must stay with you for 24 hours following the procedure.  For the next 24 hours, DO NOT: -Drive a car -Advertising copywriterperate machinery -Drink alcoholic beverages -Take any medication unless instructed by your physician -Make any legal decisions or sign important papers.  Meals: Start  as gelatin or soup. Progress to regular foods as tolerated. Avoid greasy, spicy, heavy foods. If nausea and/or vomiting occur, drink only clear liquids until the nausea and/or vomiting subsides. Call your physician if vomiting continues. ° °Special Instructions/Symptoms: °Your throat may feel dry or sore from the anesthesia or  the breathing tube placed in your throat during surgery. If this causes discomfort, gargle with warm salt water. The discomfort should disappear within 24 hours. ° °If you had a scopolamine patch placed behind your ear for the management of post- operative nausea and/or vomiting: ° °1. The medication in the patch is effective for 72 hours, after which it should be removed.  Wrap patch in a tissue and discard in the trash. Wash hands thoroughly with soap and water. °2. You may remove the patch earlier than 72 hours if you experience unpleasant side effects which may include dry mouth, dizziness or visual disturbances. °3. Avoid touching the patch. Wash your hands with soap and water after contact with the patch. °   ° °

## 2017-09-25 NOTE — Transfer of Care (Signed)
Immediate Anesthesia Transfer of Care Note  Patient: Christy Howell  Procedure(s) Performed: RIGHT CARPAL TUNNEL RELEASE, LEFT CARPAL TUNNEL INJECTION (Right Wrist) STEROID INJECTION (Left Wrist)  Patient Location: PACU  Anesthesia Type:General  Level of Consciousness: awake, sedated and patient cooperative  Airway & Oxygen Therapy: Patient Spontanous Breathing and Patient connected to face mask oxygen  Post-op Assessment: Report given to RN and Post -op Vital signs reviewed and stable  Post vital signs: Reviewed and stable  Last Vitals:  Vitals Value Taken Time  BP 150/82 09/25/2017  9:30 AM  Temp    Pulse 88 09/25/2017  9:30 AM  Resp 22 09/25/2017  9:30 AM  SpO2 87 % 09/25/2017  9:30 AM  Vitals shown include unvalidated device data.  Last Pain:  Vitals:   09/25/17 0808  TempSrc: Oral  PainSc: 0-No pain         Complications: No apparent anesthesia complications

## 2017-09-25 NOTE — Anesthesia Postprocedure Evaluation (Signed)
Anesthesia Post Note  Patient: Christy Howell  Procedure(s) Performed: RIGHT CARPAL TUNNEL RELEASE, LEFT CARPAL TUNNEL INJECTION (Right Wrist) STEROID INJECTION (Left Wrist)     Patient location during evaluation: PACU Anesthesia Type: General Level of consciousness: awake and alert Pain management: pain level controlled Vital Signs Assessment: post-procedure vital signs reviewed and stable Respiratory status: spontaneous breathing, nonlabored ventilation, respiratory function stable and patient connected to nasal cannula oxygen Cardiovascular status: blood pressure returned to baseline and stable Postop Assessment: no apparent nausea or vomiting Anesthetic complications: no    Last Vitals:  Vitals:   09/25/17 1015 09/25/17 1041  BP:  (!) 155/73  Pulse: 78 71  Resp: 13 12  Temp:  (!) 36.4 C  SpO2: 94% 95%    Last Pain:  Vitals:   09/25/17 1041  TempSrc: Oral  PainSc: 0-No pain                 Eldar Robitaille S

## 2017-09-25 NOTE — Anesthesia Procedure Notes (Signed)
Procedure Name: LMA Insertion Date/Time: 09/25/2017 8:57 AM Performed by: Gar GibbonKeeton, Kimisha Eunice S, CRNA Pre-anesthesia Checklist: Patient identified, Emergency Drugs available, Suction available and Patient being monitored Patient Re-evaluated:Patient Re-evaluated prior to induction Oxygen Delivery Method: Circle system utilized Preoxygenation: Pre-oxygenation with 100% oxygen Induction Type: IV induction Ventilation: Mask ventilation without difficulty LMA: LMA inserted LMA Size: 4.0 Number of attempts: 1 Airway Equipment and Method: Bite block Placement Confirmation: positive ETCO2 Tube secured with: Tape Dental Injury: Teeth and Oropharynx as per pre-operative assessment

## 2017-09-25 NOTE — H&P (Signed)
PREOPERATIVE H&P  Chief Complaint: bilateral carpal tunnel syndrome  HPI: Christy Howell is a 42 y.o. female who presents for surgical treatment of bilateral carpal tunnel syndrome.  She denies any changes in medical history.  Past Medical History:  Diagnosis Date  . Anxiety   . Arthritis   . Carpal tunnel syndrome on both sides   . GERD (gastroesophageal reflux disease)    Past Surgical History:  Procedure Laterality Date  . CESAREAN SECTION     x 1  . MOUTH SURGERY     wisdom teeth  . TUBAL LIGATION     Social History   Socioeconomic History  . Marital status: Married    Spouse name: Not on file  . Number of children: 3  . Years of education: Not on file  . Highest education level: Not on file  Occupational History  . Occupation: runs a Comptroller  . Financial resource strain: Not on file  . Food insecurity:    Worry: Not on file    Inability: Not on file  . Transportation needs:    Medical: Not on file    Non-medical: Not on file  Tobacco Use  . Smoking status: Current Every Day Smoker    Packs/day: 1.00    Years: 25.00    Pack years: 25.00    Types: Cigarettes  . Smokeless tobacco: Never Used  . Tobacco comment: tobacco info given  Substance and Sexual Activity  . Alcohol use: No  . Drug use: No  . Sexual activity: Yes    Birth control/protection: Surgical, IUD    Comment: IUD due for removal 2020  Lifestyle  . Physical activity:    Days per week: Not on file    Minutes per session: Not on file  . Stress: Not on file  Relationships  . Social connections:    Talks on phone: Not on file    Gets together: Not on file    Attends religious service: Not on file    Active member of club or organization: Not on file    Attends meetings of clubs or organizations: Not on file    Relationship status: Not on file  Other Topics Concern  . Not on file  Social History Narrative   Married, 3 children   Right handed   12 th grade   4-5 16  oz diet sodas daily   Family History  Problem Relation Age of Onset  . Rectal cancer Maternal Grandmother   . Colon cancer Neg Hx   . Esophageal cancer Neg Hx    Allergies  Allergen Reactions  . Kiwi Extract     Throat itches and hives  . Strawberry Extract Hives    blistering  . Penicillins Rash   Prior to Admission medications   Medication Sig Start Date End Date Taking? Authorizing Provider  levonorgestrel (MIRENA, 52 MG,) 20 MCG/24HR IUD 20 mcg by Intrauterine route once.   Yes [provider]  LORazepam (ATIVAN) 0.5 MG tablet Take 1 tablet (0.5 mg total) by mouth every 6 (six) hours as needed for anxiety. 09/11/17  Yes Ivery Quale, PA-C  meloxicam (MOBIC) 7.5 MG tablet Take 1 tablet (7.5 mg total) by mouth 2 (two) times daily as needed for pain. Patient taking differently: Take 7.5 mg by mouth daily.  07/31/17  Yes Tarry Kos, MD  omeprazole (PRILOSEC) 40 MG capsule Take 1 capsule (40 mg total) by mouth daily. 09/13/17  Yes Danis,  Starr LakeHenry L III, MD  PARoxetine (PAXIL) 40 MG tablet Take 1 tablet (40 mg total) by mouth daily. 09/13/17  Yes Gottschalk, Kathie RhodesAshly M, DO     Positive ROS: All other systems have been reviewed and were otherwise negative with the exception of those mentioned in the HPI and as above.  Physical Exam: General: Alert, no acute distress Cardiovascular: No pedal edema Respiratory: No cyanosis, no use of accessory musculature GI: abdomen soft Skin: No lesions in the area of chief complaint Neurologic: Sensation intact distally Psychiatric: Patient is competent for consent with normal mood and affect Lymphatic: no lymphedema  MUSCULOSKELETAL: exam stable  Assessment: bilateral carpal tunnel syndrome  Plan: Plan for Procedure(s): RIGHT CARPAL TUNNEL RELEASE, LEFT CARPAL TUNNEL INJECTION STEROID INJECTION  The risks benefits and alternatives were discussed with the patient including but not limited to the risks of nonoperative treatment,  versus surgical intervention including infection, bleeding, nerve injury,  blood clots, cardiopulmonary complications, morbidity, mortality, among others, and they were willing to proceed.   Glee ArvinMichael Orchid Glassberg, MD   09/25/2017 7:31 AM

## 2017-09-25 NOTE — Anesthesia Preprocedure Evaluation (Addendum)
Anesthesia Evaluation  Patient identified by MRN, date of birth, ID band Patient awake    Reviewed: Allergy & Precautions, NPO status , Patient's Chart, lab work & pertinent test results  Airway Mallampati: II  TM Distance: >3 FB Neck ROM: Full    Dental no notable dental hx.    Pulmonary Current Smoker,    Pulmonary exam normal breath sounds clear to auscultation       Cardiovascular negative cardio ROS Normal cardiovascular exam Rhythm:Regular Rate:Normal     Neuro/Psych Anxiety negative neurological ROS     GI/Hepatic Neg liver ROS, GERD  Medicated,  Endo/Other  negative endocrine ROS  Renal/GU negative Renal ROS  negative genitourinary   Musculoskeletal negative musculoskeletal ROS (+)   Abdominal   Peds negative pediatric ROS (+)  Hematology negative hematology ROS (+)   Anesthesia Other Findings   Reproductive/Obstetrics negative OB ROS                             Anesthesia Physical Anesthesia Plan  ASA: II  Anesthesia Plan: General   Post-op Pain Management:    Induction: Intravenous  PONV Risk Score and Plan: 3 and Ondansetron, Dexamethasone and Treatment may vary due to age or medical condition  Airway Management Planned: LMA  Additional Equipment:   Intra-op Plan:   Post-operative Plan:   Informed Consent: I have reviewed the patients History and Physical, chart, labs and discussed the procedure including the risks, benefits and alternatives for the proposed anesthesia with the patient or authorized representative who has indicated his/her understanding and acceptance.   Dental advisory given  Plan Discussed with: CRNA and Surgeon  Anesthesia Plan Comments:        Anesthesia Quick Evaluation

## 2017-09-25 NOTE — Op Note (Signed)
   Carpal tunnel op note  DATE OF SURGERY:09/25/2017  PREOPERATIVE DIAGNOSIS:  Bilateral carpal tunnel syndrome  POSTOPERATIVE DIAGNOSIS: same  PROCEDURE: 1. Right carpal tunnel release. CPT 64721 2. Left carpal tunnel injection.  SURGEON: Surgeon(s): Tarry KosXu, Naiping M, MD  ASSIST: Oneal GroutMary Lindsey Stanbery, PA-C  ANESTHESIA:  general  TOURNIQUET TIME: less than 10 minutes  BLOOD LOSS: Minimal.  COMPLICATIONS: None.  PATHOLOGY: None.  INDICATIONS: The patient is a 42 y.o. -year-old female who presented with carpal tunnel syndrome failing nonsurgical management, indicated for surgical release.  DESCRIPTION OF PROCEDURE: The patient was identified in the preoperative holding area.  The operative site was marked by the surgeon and confirmed by the patient.  He was brought back to the operating room.  Anesthesia was induced by the anesthesia team.  A well padded nonsterile tourniquet was placed. The operative extremity was prepped and draped in standard sterile fashion.  A timeout was performed.  Preoperative antibiotics were given.   A palmar incision was made about 5 mm ulnar to the thenar crease.  The palmar aponeurosis was exposed and divided in line with the skin incision. The palmaris brevis was visualized and divided.  The distal edge of the transcarpal ligament was identified. A hemostat was inserted into the carpal tunnel to protect the median nerve and the flexor tendons. Then, the transverse carpal ligament was released under direct visualization. Proximally, a subcutaneous tunnel was made allowing a Sewell retractor to be placed. Then, the distal portion of the antebrachial fascia was released. Distally, all fibrous bands were released. The median nerve was visualized, and the fat pad was exposed. Following release, local infiltration with 0.25% of Sensorcaine was given. The tourniquet was deflated. Hemostasis achieved.  Wound was irrigated and closed with 4-0 nylon sutures. Sterile  dressing applied.  Under sterile prep we then injected the left carpal tunnel with 1 cc each of Depo-Medrol and 1% lidocaine and 0.5% bupivacaine through the FCR sheath.  A Band-Aid was applied.   The patient was transferred to the recovery room in stable condition after all counts were correct.  POSTOPERATIVE PLAN: To start nerve gliding exercises as tolerated and no heavy lifting for four weeks.

## 2017-09-26 ENCOUNTER — Encounter (HOSPITAL_BASED_OUTPATIENT_CLINIC_OR_DEPARTMENT_OTHER): Payer: Self-pay | Admitting: Orthopaedic Surgery

## 2017-10-02 ENCOUNTER — Inpatient Hospital Stay (INDEPENDENT_AMBULATORY_CARE_PROVIDER_SITE_OTHER): Payer: Self-pay | Admitting: Orthopaedic Surgery

## 2017-10-03 ENCOUNTER — Inpatient Hospital Stay (INDEPENDENT_AMBULATORY_CARE_PROVIDER_SITE_OTHER): Payer: Self-pay | Admitting: Orthopaedic Surgery

## 2017-10-07 ENCOUNTER — Encounter (INDEPENDENT_AMBULATORY_CARE_PROVIDER_SITE_OTHER): Payer: Self-pay | Admitting: Physician Assistant

## 2017-10-07 ENCOUNTER — Ambulatory Visit (INDEPENDENT_AMBULATORY_CARE_PROVIDER_SITE_OTHER): Payer: Self-pay | Admitting: Physician Assistant

## 2017-10-07 DIAGNOSIS — Z9889 Other specified postprocedural states: Secondary | ICD-10-CM | POA: Insufficient documentation

## 2017-10-07 NOTE — Progress Notes (Signed)
Post-Op Visit Note   Patient: Christy RainwaterJuni A Buehring           Date of Birth: 1975-10-18           MRN: [id number] Visit Date: 10/07/2017 PCP: Raliegh IpGottschalk, Ashly M, DO   Assessment & Plan:  Chief Complaint:  Chief Complaint  Patient presents with  . Right Wrist - Routine Post Op, Follow-up   Visit Diagnoses:  1. S/P carpal tunnel release     Plan: Patient is a pleasant 42 year old female who presents our clinic today 12 days status post right carpal tunnel release, date of surgery 09/25/2017.  She has been doing very well.  Minimal pain.  Occasional numbness to her fingertips which is starting to resolve.  No fevers, chills or any other systemic symptoms.  In regards to the left wrist, we injected the left carpal tunnel during surgical intervention and she is doing great with that.  Examination of her right wrist reveals a well-healing surgical incision with nylon sutures in place.  No swelling and no signs of cellulitis or infection.  Today, nylon sutures were removed and Steri-Strips applied.  We will give the patient a removable splint to wear for comfort.  Still no submerging her wrist or lifting anything more than a coffee mug until she is 4 weeks out from surgery.  Follow-up with us in 4 weeks time for recheck.  Call if concerns or questions in the meantime.  Follow-Up Instructions: Return in about 1 month (around 11/04/2017).   Orders:  No orders of the defined types were placed in this encounter.  No orders of the defined types were placed in this encounter.   Imaging: No new imaging  PMFS History: Patient Active Problem List   Diagnosis Date Noted  . S/P carpal tunnel release 10/07/2017  . Carpal tunnel syndrome on left 09/25/2017  . Tobacco use 07/24/2017  . Generalized anxiety disorder with panic attacks 04/11/2017  . Carpal tunnel syndrome on right 06/29/2013   Past Medical History:  Diagnosis Date  . Anxiety   . Arthritis   . Carpal tunnel syndrome on both sides   .  GERD (gastroesophageal reflux disease)     Family History  Problem Relation Age of Onset  . Rectal cancer Maternal Grandmother   . Colon cancer Neg Hx   . Esophageal cancer Neg Hx     Past Surgical History:  Procedure Laterality Date  . CARPAL TUNNEL RELEASE Right 09/25/2017   Procedure: RIGHT CARPAL TUNNEL RELEASE, LEFT CARPAL TUNNEL INJECTION;  Surgeon: Tarry KosXu, Naiping M, MD;  Location: [facility];  Service: Orthopedics;  Laterality: Right;  . CESAREAN SECTION     x 1  . MOUTH SURGERY     wisdom teeth  . STERIOD INJECTION Left 09/25/2017   Procedure: STEROID INJECTION;  Surgeon: Tarry KosXu, Naiping M, MD;  Location: [facility];  Service: Orthopedics;  Laterality: Left;  . TUBAL LIGATION     Social History   Occupational History  . Occupation: runs a restarant  Tobacco Use  . Smoking status: Current Every Day Smoker    Packs/day: 1.00    Years: 25.00    Pack years: 25.00    Types: Cigarettes  . Smokeless tobacco: Never Used  . Tobacco comment: tobacco info given  Substance and Sexual Activity  . Alcohol use: No  . Drug use: No  . Sexual activity: Yes    Birth control/protection: Surgical, IUD    Comment: IUD due for removal  2020    

## 2017-10-21 ENCOUNTER — Encounter (HOSPITAL_COMMUNITY): Admission: RE | Payer: Self-pay | Source: Ambulatory Visit

## 2017-10-21 ENCOUNTER — Ambulatory Visit (HOSPITAL_COMMUNITY): Admission: RE | Admit: 2017-10-21 | Payer: Self-pay | Source: Ambulatory Visit | Admitting: Gastroenterology

## 2017-10-21 SURGERY — MANOMETRY, ESOPHAGUS
Anesthesia: Topical

## 2017-10-21 NOTE — Progress Notes (Signed)
Pt was called and reminded about appointment on Friday afternoon. I talked with patient and she stated she understood and would be here. Pt did not show up at appointment time. Called and no one answered at 0845. Left message for patient to call us and if she was not planning to come she needed to call Dr. Myrtie Neitheranis' office to reschedule.

## 2017-10-28 ENCOUNTER — Ambulatory Visit: Payer: Self-pay | Admitting: Family Medicine

## 2017-10-31 ENCOUNTER — Ambulatory Visit: Payer: Self-pay | Admitting: Gastroenterology

## 2017-11-07 ENCOUNTER — Ambulatory Visit (INDEPENDENT_AMBULATORY_CARE_PROVIDER_SITE_OTHER): Payer: Self-pay | Admitting: Orthopaedic Surgery

## 2017-11-07 ENCOUNTER — Encounter (INDEPENDENT_AMBULATORY_CARE_PROVIDER_SITE_OTHER): Payer: Self-pay | Admitting: Orthopaedic Surgery

## 2017-11-07 DIAGNOSIS — G5601 Carpal tunnel syndrome, right upper limb: Secondary | ICD-10-CM

## 2017-11-07 DIAGNOSIS — M7711 Lateral epicondylitis, right elbow: Secondary | ICD-10-CM

## 2017-11-07 NOTE — Progress Notes (Signed)
Office Visit Note   Patient: Christy Howell           Date of Birth: 1975-11-01           MRN: [id number] Visit Date: 11/07/2017              Requested by: Raliegh Ip, DO [address] [place], [place] [address] PCP: Raliegh Ip, DO   Assessment & Plan: Visit Diagnoses:  1. Right carpal tunnel syndrome   2. Lateral epicondylitis, right elbow     Plan: Patient is doing well from her right carpal tunnel release.  At this point she can increase her activity as tolerated.  In terms of her right elbow pain I think this is lateral epicondylitis.  Home exercises were demonstrated.  She will continue to wear the counterforce brace and take NSAIDs as needed.  Currently is not bad enough for an injection.  Follow-up as needed.  Follow-Up Instructions: Return if symptoms worsen or fail to improve.   Orders:  No orders of the defined types were placed in this encounter.  No orders of the defined types were placed in this encounter.     Procedures: No procedures performed   Clinical Data: No additional findings.   Subjective: Chief Complaint  Patient presents with  . Right Wrist - Pain    Christy Howell is 6 weeks status post right carpal tunnel release and left carpal tunnel injection.  She is doing well in that regard.  She is also complaining of lateral right elbow pain that is worse with grasping and picking up things.  Denies any numbness and tingling.   Review of Systems  Constitutional: Negative.   HENT: Negative.   Eyes: Negative.   Respiratory: Negative.   Cardiovascular: Negative.   Endocrine: Negative.   Musculoskeletal: Negative.   Neurological: Negative.   Hematological: Negative.   Psychiatric/Behavioral: Negative.   All other systems reviewed and are negative.    Objective: Vital Signs: There were no vitals taken for this visit.  Physical Exam  Constitutional: She is oriented to person, place, and time. She appears well-developed and  well-nourished.  Pulmonary/Chest: Effort normal.  Neurological: She is alert and oriented to person, place, and time.  Skin: Skin is warm. Capillary refill takes less than 2 seconds.  Psychiatric: She has a normal mood and affect. Her behavior is normal. Judgment and thought content normal.  Nursing note and vitals reviewed.   Ortho Exam Right hand exam shows a fully healed surgical scar.  She has mild ulnar-sided pillar pain.  No neurovascular compromise or deficits. Right elbow exam shows tenderness of the lateral epicondyle with pain with resisted ECRB extension. Specialty Comments:  No specialty comments available.  Imaging: No results found.   PMFS History: Patient Active Problem List   Diagnosis Date Noted  . S/P carpal tunnel release 10/07/2017  . Carpal tunnel syndrome on left 09/25/2017  . Tobacco use 07/24/2017  . Generalized anxiety disorder with panic attacks 04/11/2017  . Carpal tunnel syndrome on right 06/29/2013   Past Medical History:  Diagnosis Date  . Anxiety   . Arthritis   . Carpal tunnel syndrome on both sides   . GERD (gastroesophageal reflux disease)     Family History  Problem Relation Age of Onset  . Rectal cancer Maternal Grandmother   . Colon cancer Neg Hx   . Esophageal cancer Neg Hx     Past Surgical History:  Procedure Laterality Date  . CARPAL  TUNNEL RELEASE Right 09/25/2017   Procedure: RIGHT CARPAL TUNNEL RELEASE, LEFT CARPAL TUNNEL INJECTION;  Surgeon: Tarry Kos, MD;  Location: [facility];  Service: Orthopedics;  Laterality: Right;  . CESAREAN SECTION     x 1  . MOUTH SURGERY     wisdom teeth  . STERIOD INJECTION Left 09/25/2017   Procedure: STEROID INJECTION;  Surgeon: Tarry Kos, MD;  Location: [facility];  Service: Orthopedics;  Laterality: Left;  . TUBAL LIGATION     Social History   Occupational History  . Occupation: runs a restarant  Tobacco Use  . Smoking status: Current Every Day  Smoker    Packs/day: 1.00    Years: 25.00    Pack years: 25.00    Types: Cigarettes  . Smokeless tobacco: Never Used  . Tobacco comment: tobacco info given  Substance and Sexual Activity  . Alcohol use: No  . Drug use: No  . Sexual activity: Yes    Birth control/protection: Surgical, IUD    Comment: IUD due for removal 2020

## 2017-11-12 ENCOUNTER — Ambulatory Visit: Payer: Self-pay | Admitting: Gastroenterology

## 2018-03-21 ENCOUNTER — Encounter: Payer: Self-pay | Admitting: Family Medicine

## 2018-03-21 ENCOUNTER — Ambulatory Visit (INDEPENDENT_AMBULATORY_CARE_PROVIDER_SITE_OTHER): Payer: Self-pay | Admitting: Family Medicine

## 2018-03-21 VITALS — BP 115/67 | HR 76 | Temp 97.9°F | Ht 60.0 in | Wt 205.0 lb

## 2018-03-21 DIAGNOSIS — N3001 Acute cystitis with hematuria: Secondary | ICD-10-CM

## 2018-03-21 DIAGNOSIS — R300 Dysuria: Secondary | ICD-10-CM

## 2018-03-21 LAB — URINALYSIS, COMPLETE
Bilirubin, UA: NEGATIVE
Glucose, UA: NEGATIVE
Ketones, UA: NEGATIVE
LEUKOCYTES UA: NEGATIVE
Nitrite, UA: NEGATIVE
Protein, UA: NEGATIVE
Specific Gravity, UA: 1.02 (ref 1.005–1.030)
Urobilinogen, Ur: 1 mg/dL (ref 0.2–1.0)
pH, UA: 6 (ref 5.0–7.5)

## 2018-03-21 LAB — MICROSCOPIC EXAMINATION: Renal Epithel, UA: NONE SEEN /hpf

## 2018-03-21 MED ORDER — SULFAMETHOXAZOLE-TRIMETHOPRIM 800-160 MG PO TABS: 1.0000 | ORAL_TABLET | Freq: Two times a day (BID) | ORAL | 0 refills | Status: DC

## 2018-03-21 NOTE — Patient Instructions (Signed)

## 2018-03-21 NOTE — Progress Notes (Signed)
DPO:EUMPNTIRWE, Christy Searing, DO Chief Complaint  Patient presents with  . Urinary Tract Infection    back pain, urine has a strong odor, some loss of control    Current Issues:  Presents with 2 days of dysuria, urinary urgency and urinary frequency Associated symptoms include:  cloudy urine, flank pain on the right, urinary incontinence and malodorous urine  There is a previous history of of similar symptoms. Sexually active:  Yes with female.   No concern for STI.  Prior to Admission medications   Medication Sig Start Date End Date Taking? Authorizing Provider  levonorgestrel (MIRENA, 52 MG,) 20 MCG/24HR IUD 20 mcg by Intrauterine route once.    [provider]  LORazepam (ATIVAN) 0.5 MG tablet Take 1 tablet (0.5 mg total) by mouth every 6 (six) hours as needed for anxiety. 09/11/17   Ivery Quale, PA-C  omeprazole (PRILOSEC) 40 MG capsule Take 1 capsule (40 mg total) by mouth daily. 09/13/17   Sherrilyn Rist, MD  PARoxetine (PAXIL) 40 MG tablet Take 1 tablet (40 mg total) by mouth daily. 09/13/17   Raliegh Ip, DO  sulfamethoxazole-trimethoprim (BACTRIM DS,SEPTRA DS) 800-160 MG tablet Take 1 tablet by mouth 2 (two) times daily. 03/21/18   Sonny Masters, FNP    Review of Systems  Constitutional: Negative for chills, fever and malaise/fatigue.  Cardiovascular: Negative for chest pain and leg swelling.  Gastrointestinal: Negative for abdominal pain, nausea and vomiting.  Genitourinary: Positive for dysuria, flank pain (right), frequency and urgency.       Denies vaginal symptoms  Neurological: Negative for weakness and headaches.  All other systems reviewed and are negative.    PE:  BP 115/67   Pulse 76   Temp 97.9 F (36.6 C) (Oral)   Ht 5' (1.524 m)   Wt 205 lb (93 kg)   BMI 40.04 kg/m  Physical Exam Vitals signs and nursing note reviewed.  Constitutional:      General: She is not in acute distress.    Appearance: Normal appearance. She is  well-developed and well-groomed. She is not ill-appearing or toxic-appearing.  HENT:     Head: Normocephalic and atraumatic.     Mouth/Throat:     Mouth: Mucous membranes are moist.     Pharynx: Oropharynx is clear.  Eyes:     Conjunctiva/sclera: Conjunctivae normal.     Pupils: Pupils are equal, round, and reactive to light.  Cardiovascular:     Rate and Rhythm: Normal rate and regular rhythm.     Heart sounds: Normal heart sounds. No murmur. No friction rub. No gallop.   Pulmonary:     Effort: Pulmonary effort is normal. No respiratory distress.     Breath sounds: Normal breath sounds.  Abdominal:     General: Abdomen is protuberant. Bowel sounds are normal.     Palpations: Abdomen is soft.     Tenderness: There is no abdominal tenderness. There is right CVA tenderness. There is no left CVA tenderness, guarding or rebound.  Skin:    General: Skin is warm and dry.     Capillary Refill: Capillary refill takes less than 2 seconds.  Neurological:     General: No focal deficit present.     Mental Status: She is alert and oriented to person, place, and time.     Motor: No weakness.  Psychiatric:        Mood and Affect: Mood normal.        Behavior: Behavior normal.  Behavior is cooperative.        Thought Content: Thought content normal.        Judgment: Judgment normal.    *  Results for orders placed or performed during the hospital encounter of 09/10/17  CBC with Differential  Result Value Ref Range   WBC 11.4 (H) 4.0 - 10.5 K/uL   RBC 4.74 3.87 - 5.11 MIL/uL   Hemoglobin 14.4 12.0 - 15.0 g/dL   HCT 16.3 84.6 - 65.9 %   MCV 89.2 78.0 - 100.0 fL   MCH 30.4 26.0 - 34.0 pg   MCHC 34.0 30.0 - 36.0 g/dL   RDW 93.5 70.1 - 77.9 %   Platelets 235 150 - 400 K/uL   Neutrophils Relative % 72 %   Neutro Abs 8.2 (H) 1.7 - 7.7 K/uL   Lymphocytes Relative 19 %   Lymphs Abs 2.1 0.7 - 4.0 K/uL   Monocytes Relative 8 %   Monocytes Absolute 0.9 0.1 - 1.0 K/uL   Eosinophils Relative 1  %   Eosinophils Absolute 0.1 0.0 - 0.7 K/uL   Basophils Relative 0 %   Basophils Absolute 0.0 0.0 - 0.1 K/uL  Basic metabolic panel  Result Value Ref Range   Sodium 137 135 - 145 mmol/L   Potassium 3.2 (L) 3.5 - 5.1 mmol/L   Chloride 102 98 - 111 mmol/L   CO2 25 22 - 32 mmol/L   Glucose, Bld 91 70 - 99 mg/dL   BUN 8 6 - 20 mg/dL   Creatinine, Ser 3.90 0.44 - 1.00 mg/dL   Calcium 9.0 8.9 - 30.0 mg/dL   GFR calc non Af Amer >60 >60 mL/min   GFR calc Af Amer >60 >60 mL/min   Anion gap 10 5 - 15  Troponin I  Result Value Ref Range   Troponin I <0.03 <0.03 ng/mL   Urinalysis in office: many bacteria, mucus threads present, 1+ blood, negative nitrites, negative leukocytes.   Assessment and Plan:  Christy Howell was seen today for urinary tract infection.  Diagnoses and all orders for this visit:  Acute cystitis with hematuria Increase fluid intake. Avoid bladder irritants such as caffeine. Medications as prescribed. Report any new or worsening symptoms. Culture pending, will change treatment if warranted.  -     sulfamethoxazole-trimethoprim (BACTRIM DS,SEPTRA DS) 800-160 MG tablet; Take 1 tablet by mouth 2 (two) times daily. -     Urine culture  Dysuria -     Urinalysis, Complete -     Urine culture  The above assessment and management plan was discussed with the patient. The patient verbalized understanding of and has agreed to the management plan. Patient is aware to call the clinic if symptoms fail to improve or worsen. Patient is aware when to return to the clinic for a follow-up visit. Patient educated on when it is appropriate to go to the emergency department.   Kari Baars, FNP-C [facility] [address] [place], [place] [address] [phone]

## 2018-03-23 LAB — URINE CULTURE

## 2018-04-04 ENCOUNTER — Encounter: Payer: Self-pay | Admitting: Family Medicine

## 2018-04-04 ENCOUNTER — Ambulatory Visit (INDEPENDENT_AMBULATORY_CARE_PROVIDER_SITE_OTHER): Payer: Self-pay | Admitting: Family Medicine

## 2018-04-04 VITALS — BP 113/72 | HR 79 | Temp 97.9°F | Ht 60.0 in | Wt 204.0 lb

## 2018-04-04 DIAGNOSIS — Z87440 Personal history of urinary (tract) infections: Secondary | ICD-10-CM

## 2018-04-04 LAB — MICROSCOPIC EXAMINATION
Epithelial Cells (non renal): >10 /hpf — AB (ref 0–10)
Renal Epithel, UA: NONE SEEN /hpf

## 2018-04-04 LAB — URINALYSIS, COMPLETE
Bilirubin, UA: NEGATIVE
Glucose, UA: NEGATIVE
KETONES UA: NEGATIVE
Leukocytes, UA: NEGATIVE
Nitrite, UA: NEGATIVE
Protein, UA: NEGATIVE
SPEC GRAV UA: 1.015 (ref 1.005–1.030)
UUROB: 1 mg/dL (ref 0.2–1.0)
pH, UA: 7 (ref 5.0–7.5)

## 2018-04-04 MED ORDER — PHENTERMINE HCL 37.5 MG PO TABS: 18.7500 mg | ORAL_TABLET | Freq: Every day | ORAL | 0 refills | Status: DC

## 2018-04-04 MED ORDER — CEPHALEXIN 500 MG PO CAPS: 500.0000 mg | ORAL_CAPSULE | Freq: Two times a day (BID) | ORAL | 0 refills | Status: AC

## 2018-04-04 MED ORDER — PHENAZOPYRIDINE HCL 100 MG PO TABS: 100.0000 mg | ORAL_TABLET | Freq: Three times a day (TID) | ORAL | 0 refills | Status: DC | PRN

## 2018-04-04 NOTE — Progress Notes (Signed)
Subjective: CC: recheck UTI PCP: Raliegh IpGottschalk, Dabney Dever M, DO ZOX:[id number]:Christy Howell is a 43 y.o. female presenting to clinic today for:  1. Urinary symptoms Patient was seen 2 weeks ago for urinary tract infection.  She was prescribed Septra.  Urine culture did not demonstrate significant growth.  She notes persistent symptoms of urgency, dysuria.  No visualized hematuria.  Denies any nausea, vomiting, abdominal pain, fevers or new back pain.  2.  Obesity Patient reports difficulty losing weight despite reduction in consumption of food.  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  She does report regular consumption of salads with grilled chicken, sometimes she will have chicken and dumplings.  She does not add salt to food but does report edema in both hands.  She does report regular consumption of water and has eliminated sodas since last visit.  She purchased an over-the-counter weight loss medicine but is scared to take it without discussing this with me first.  No structured exercise, as she is often busy within the restaurant.   ROS: Per HPI  Allergies  Allergen Reactions  . Kiwi Extract     Throat itches and hives  . Strawberry Extract Hives    blistering  . Penicillins Rash   Past Medical History:  Diagnosis Date  . Anxiety   . Arthritis   . Carpal tunnel syndrome on both sides   . GERD (gastroesophageal reflux disease)     Current Outpatient Medications:  .  levonorgestrel (MIRENA, 52 MG,) 20 MCG/24HR IUD, 20 mcg by Intrauterine route once., Disp: , Rfl:  .  LORazepam (ATIVAN) 0.5 MG tablet, Take 1 tablet (0.5 mg total) by mouth every 6 (six) hours as needed for anxiety., Disp: 6 tablet, Rfl: 0 .  omeprazole (PRILOSEC) 40 MG capsule, Take 1 capsule (40 mg total) by mouth daily., Disp: 30 capsule, Rfl: 1 .  PARoxetine (PAXIL) 40 MG tablet, Take 1 tablet (40 mg total) by mouth daily., Disp: 90 tablet, Rfl: 0 Social History   Socioeconomic History    . Marital status: Married    Spouse name: Not on file  . Number of children: 3  . Years of education: Not on file  . Highest education level: Not on file  Occupational History  . Occupation: runs a [employer]  Social Needs  . Financial resource strain: Not on file  . Food insecurity:    Worry: Not on file    Inability: Not on file  . Transportation needs:    Medical: Not on file    Non-medical: Not on file  Tobacco Use  . Smoking status: Current Every Day Smoker    Packs/day: 1.00    Years: 25.00    Pack years: 25.00    Types: Cigarettes  . Smokeless tobacco: Never Used  . Tobacco comment: tobacco info given  Substance and Sexual Activity  . Alcohol use: No  . Drug use: No  . Sexual activity: Yes    Birth control/protection: Surgical, I.U.D.    Comment: IUD due for removal 2020  Lifestyle  . Physical activity:    Days per week: Not on file    Minutes per session: Not on file  . Stress: Not on file  Relationships  . Social connections:    Talks on phone: Not on file    Gets together: Not on file    Attends religious service: Not on file    Active member of club or organization: Not on file  Attends meetings of clubs or organizations: Not on file    Relationship status: Not on file  . Intimate partner violence:    Fear of current or ex partner: Not on file    Emotionally abused: Not on file    Physically abused: Not on file    Forced sexual activity: Not on file  Other Topics Concern  . Not on file  Social History Narrative   Married, 3 children   Right handed   12 th grade   4-5 16 oz diet sodas daily   Family History  Problem Relation Age of Onset  . Rectal cancer Maternal Grandmother   . Colon cancer Neg Hx   . Esophageal cancer Neg Hx     Objective: Office vital signs reviewed. BP 113/72   Pulse 79   Temp 97.9 F (36.6 C) (Oral)   Ht 5' (1.524 m)   Wt 204 lb (92.5 kg)   BMI 39.84 kg/m   Physical Examination:  General: Awake, alert, well  nourished, No acute distress HEENT: Normal, sclera white, MMM Cardio: regular rate and rhythm, S1S2 heard, no murmurs appreciated Pulm: clear to auscultation bilaterally, no wheezes, rhonchi or rales; normal work of breathing on room air GU: no suprapubic TTP, no CVA ttp Extremities: warm, well perfused, Non pitting edema of hands. No cyanosis or clubbing; +2 pulses bilaterally  Assessment/ Plan: 43 y.o. female   1. Recent urinary tract infection Physical exam unremarkable but patient with persistent evidence of urinary tract infection on microscopy.  She has 6-10 white blood cells, 3-10 red blood cells and moderate bacteria.  Of note there were rater than 10 epithelial cells noted as well.  This is been sent for culture.  I have prescribed her Keflex to take twice daily for the next 7 days as well as Pyridium to take up to 3 times daily as needed.  We discussed that if culture remained negative that we should consider referral to urology for further evaluation to rule out processes that are malignant.  She voiced good understanding and is amenable to this plan. - Urinalysis, Complete - Urine Culture  2. Morbid obesity (HCC) Start phentermine 1/2 tablet daily for 1 week.  If symptoms are controlled and she is able to modify diet and increase exercise okay to stay at this dose otherwise, plan to increase to 1 tablet daily.  Caution dry mouth, insomnia, increased blood pressure and constipation.  She will follow-up in 1 month, sooner if needed for recheck. - phentermine (ADIPEX-P) 37.5 MG tablet; Take 0.5-1 tablets (18.75-37.5 mg total) by mouth daily before breakfast.  Dispense: 30 tablet; Refill: 0   Orders Placed This Encounter  Procedures  . Urinalysis, Complete   Meds ordered this encounter  Medications  . cephALEXin (KEFLEX) 500 MG capsule    Sig: Take 1 capsule (500 mg total) by mouth 2 (two) times daily for 7 days.    Dispense:  14 capsule    Refill:  0  . phenazopyridine  (PYRIDIUM) 100 MG tablet    Sig: Take 1 tablet (100 mg total) by mouth 3 (three) times daily as needed for pain (x2 days).    Dispense:  10 tablet    Refill:  0  . phentermine (ADIPEX-P) 37.5 MG tablet    Sig: Take 0.5-1 tablets (18.75-37.5 mg total) by mouth daily before breakfast.    Dispense:  30 tablet    Refill:  0   The Narcotic Database has been reviewed.  There were  no red flags.     Aizley Stenseth M Dasie Chancellor, DO [facility] [phone]   

## 2018-04-04 NOTE — Patient Instructions (Signed)
Urine still looks infected.  I will send for culture.  If no growth, we will plan for referral to urology.  Mediterranean Diet A Mediterranean diet refers to food and lifestyle choices that are based on the traditions of countries located on the [employer]. This way of eating has been shown to help prevent certain conditions and improve outcomes for people who have chronic diseases, like kidney disease and heart disease. What are tips for following this plan? Lifestyle  Cook and eat meals together with your family, when possible.  Drink enough fluid to keep your urine clear or pale yellow.  Be physically active every day. This includes: ? Aerobic exercise like running or swimming. ? Leisure activities like gardening, walking, or housework.  Get 7-8 hours of sleep each night.  If recommended by your health care provider, drink red wine in moderation. This means 1 glass a day for nonpregnant women and 2 glasses a day for men. A glass of wine equals 5 oz (150 mL). Reading food labels   Check the serving size of packaged foods. For foods such as rice and pasta, the serving size refers to the amount of cooked product, not dry.  Check the total fat in packaged foods. Avoid foods that have saturated fat or trans fats.  Check the ingredients list for added sugars, such as corn syrup. Shopping  At the grocery store, buy most of your food from the areas near the walls of the store. This includes: ? Fresh fruits and vegetables (produce). ? Grains, beans, nuts, and seeds. Some of these may be available in unpackaged forms or large amounts (in bulk). ? Fresh seafood. ? Poultry and eggs. ? Low-fat dairy products.  Buy whole ingredients instead of prepackaged foods.  Buy fresh fruits and vegetables in-season from local farmers markets.  Buy frozen fruits and vegetables in resealable bags.  If you do not have access to quality fresh seafood, buy precooked frozen shrimp or canned fish,  such as tuna, salmon, or sardines.  Buy small amounts of raw or cooked vegetables, salads, or olives from the deli or salad bar at your store.  Stock your pantry so you always have certain foods on hand, such as olive oil, canned tuna, canned tomatoes, rice, pasta, and beans. Cooking  Cook foods with extra-virgin olive oil instead of using butter or other vegetable oils.  Have meat as a side dish, and have vegetables or grains as your main dish. This means having meat in small portions or adding small amounts of meat to foods like pasta or stew.  Use beans or vegetables instead of meat in common dishes like chili or lasagna.  Experiment with different cooking methods. Try roasting or broiling vegetables instead of steaming or sauteing them.  Add frozen vegetables to soups, stews, pasta, or rice.  Add nuts or seeds for added healthy fat at each meal. You can add these to yogurt, salads, or vegetable dishes.  Marinate fish or vegetables using olive oil, lemon juice, garlic, and fresh herbs. Meal planning   Plan to eat 1 vegetarian meal one day each week. Try to work up to 2 vegetarian meals, if possible.  Eat seafood 2 or more times a week.  Have healthy snacks readily available, such as: ? Vegetable sticks with hummus. ? [place] yogurt. ? Fruit and nut trail mix.  Eat balanced meals throughout the week. This includes: ? Fruit: 2-3 servings a day ? Vegetables: 4-5 servings a day ? Low-fat dairy: 2 servings  a day ? Fish, poultry, or lean meat: 1 serving a day ? Beans and legumes: 2 or more servings a week ? Nuts and seeds: 1-2 servings a day ? Whole grains: 6-8 servings a day ? Extra-virgin olive oil: 3-4 servings a day  Limit red meat and sweets to only a few servings a month What are my food choices?  Mediterranean diet ? Recommended ? Grains: Whole-grain pasta. Brown rice. Bulgar wheat. Polenta. Couscous. Whole-wheat bread. Orpah Cobb. ? Vegetables: Artichokes.  Beets. Broccoli. Cabbage. Carrots. Eggplant. Green beans. Chard. Kale. Spinach. Onions. Leeks. Peas. Squash. Tomatoes. Peppers. Radishes. ? Fruits: Apples. Apricots. Avocado. Berries. Bananas. Cherries. Dates. Figs. Grapes. Lemons. Melon. Oranges. Peaches. Plums. Pomegranate. ? Meats and other protein foods: Beans. Almonds. Sunflower seeds. Pine nuts. Peanuts. Cod. Salmon. Scallops. Shrimp. Tuna. Tilapia. Clams. Oysters. Eggs. ? Dairy: Low-fat milk. Cheese. Greek yogurt. ? Beverages: Water. Red wine. Herbal tea. ? Fats and oils: Extra virgin olive oil. Avocado oil. Grape seed oil. ? Sweets and desserts: [place] yogurt with honey. Baked apples. Poached pears. Trail mix. ? Seasoning and other foods: Basil. Cilantro. Coriander. Cumin. Mint. Parsley. Sage. Rosemary. Tarragon. Garlic. Oregano. Thyme. Pepper. Balsalmic vinegar. Tahini. Hummus. Tomato sauce. Olives. Mushrooms. ? Limit these ? Grains: Prepackaged pasta or rice dishes. Prepackaged cereal with added sugar. ? Vegetables: Deep fried potatoes (french fries). ? Fruits: Fruit canned in syrup. ? Meats and other protein foods: Beef. Pork. Lamb. Poultry with skin. Hot dogs. Tomasa Blase. ? Dairy: Ice cream. Sour cream. Whole milk. ? Beverages: Juice. Sugar-sweetened soft drinks. Beer. Liquor and spirits. ? Fats and oils: Butter. Canola oil. Vegetable oil. Beef fat (tallow). Lard. ? Sweets and desserts: Cookies. Cakes. Pies. Candy. ? Seasoning and other foods: Mayonnaise. Premade sauces and marinades. ? The items listed may not be a complete list. Talk with your dietitian about what dietary choices are right for you. Summary  The Mediterranean diet includes both food and lifestyle choices.  Eat a variety of fresh fruits and vegetables, beans, nuts, seeds, and whole grains.  Limit the amount of red meat and sweets that you eat.  Talk with your health care provider about whether it is safe for you to drink red wine in moderation. This means 1 glass a  day for nonpregnant women and 2 glasses a day for men. A glass of wine equals 5 oz (150 mL). This information is not intended to replace advice given to you by your health care provider. Make sure you discuss any questions you have with your health care provider. Document Released: 10/13/2015 Document Revised: 11/15/2015 Document Reviewed: 10/13/2015 Elsevier Interactive Patient Education  2019 [employer].

## 2018-04-05 LAB — URINE CULTURE

## 2018-04-07 ENCOUNTER — Other Ambulatory Visit: Payer: Self-pay | Admitting: Family Medicine

## 2018-04-07 DIAGNOSIS — R3129 Other microscopic hematuria: Secondary | ICD-10-CM

## 2018-05-06 ENCOUNTER — Ambulatory Visit (INDEPENDENT_AMBULATORY_CARE_PROVIDER_SITE_OTHER): Payer: Self-pay | Admitting: Family Medicine

## 2018-05-06 MED ORDER — PHENTERMINE HCL 37.5 MG PO TABS: 18.7500 mg | ORAL_TABLET | Freq: Every day | ORAL | 1 refills | Status: DC

## 2018-05-06 NOTE — Progress Notes (Signed)
Subjective: CC: Follow up weight management appointment HPI: Christy Howell is a 43 y.o. female that presents today for follow up weight loss visit.   Patient reports compliance with phentermine.  She is taking 1/2 tablet before breakfast and 1/2 tablet at lunchtime.  This seems to be working better for her.  When she took a full tablet as 1 dose she felt too jittery.  She has been reducing her soda intake but still has difficulty eliminating them from her diet.  She has switched to half-and-half sweet tea and is trying to drink more water.  She continues to have carbohydrates but has reduced her carbohydrate intake and has certainly started avoiding excess salt found in prepackaged foods.  She has noticed improvement in the swelling in her hands with salt reduction but occasionally still has some swelling..  Side effects identified: Dry mouth.  Denies abdominal pain, nausea, vomiting, diarrhea, chest pain, shortness of breath, heart palpitations, insomnia, or constipation.  Fluid intake: good.     Past Medical History:  Diagnosis Date  . Anxiety   . Arthritis   . Carpal tunnel syndrome on both sides   . GERD (gastroesophageal reflux disease)     Current Outpatient Medications:  .  levonorgestrel (MIRENA, 52 MG,) 20 MCG/24HR IUD, 20 mcg by Intrauterine route once., Disp: , Rfl:  .  LORazepam (ATIVAN) 0.5 MG tablet, Take 1 tablet (0.5 mg total) by mouth every 6 (six) hours as needed for anxiety., Disp: 6 tablet, Rfl: 0 .  omeprazole (PRILOSEC) 40 MG capsule, Take 1 capsule (40 mg total) by mouth daily., Disp: 30 capsule, Rfl: 1 .  PARoxetine (PAXIL) 40 MG tablet, Take 1 tablet (40 mg total) by mouth daily., Disp: 90 tablet, Rfl: 0 .  phentermine (ADIPEX-P) 37.5 MG tablet, Take 0.5-1 tablets (18.75-37.5 mg total) by mouth daily before breakfast., Disp: 30 tablet, Rfl: 0 Social History   Socioeconomic History  . Marital status: Married    Spouse name: Not on file  . Number of children: 3   . Years of education: Not on file  . Highest education level: Not on file  Occupational History  . Occupation: runs a [employer]  . Financial resource strain: Not on file  . Food insecurity:    Worry: Not on file    Inability: Not on file  . Transportation needs:    Medical: Not on file    Non-medical: Not on file  Tobacco Use  . Smoking status: Current Every Day Smoker    Packs/day: 1.00    Years: 25.00    Pack years: 25.00    Types: Cigarettes  . Smokeless tobacco: Never Used  . Tobacco comment: tobacco info given  Substance and Sexual Activity  . Alcohol use: No  . Drug use: No  . Sexual activity: Yes    Birth control/protection: Surgical, I.U.D.    Comment: IUD due for removal 2020  Lifestyle  . Physical activity:    Days per week: Not on file    Minutes per session: Not on file  . Stress: Not on file  Relationships  . Social connections:    Talks on phone: Not on file    Gets together: Not on file    Attends religious service: Not on file    Active member of club or organization: Not on file    Attends meetings of clubs or organizations: Not on file    Relationship status: Not on file  . Intimate  partner violence:    Fear of current or ex partner: Not on file    Emotionally abused: Not on file    Physically abused: Not on file    Forced sexual activity: Not on file  Other Topics Concern  . Not on file  Social History Narrative   Married, 3 children   Right handed   12 th grade   4-5 16 oz diet sodas daily   Allergies  Allergen Reactions  . Kiwi Extract     Throat itches and hives  . Strawberry Extract Hives    blistering  . Penicillins Rash   Family History  Problem Relation Age of Onset  . Rectal cancer Maternal Grandmother   . Colon cancer Neg Hx   . Esophageal cancer Neg Hx     ROS: Per HPI  Objective: Office vital signs reviewed. BP 121/86   Pulse 90   Temp (!) 97.2 F (36.2 C) (Oral)   Ht 5' (1.524 m)   Wt 199 lb  (90.3 kg)   BMI 38.86 kg/m   Physical Examination:  General: Awake, alert, well appearing, No acute distress HEENT: Normal    Neck: No masses palpated.     Eyes: PERRLA, extraocular movement in tact, sclera white; no exophthalmos     Throat: moist mucus membranes, Airway is patent Cardio: regular rate and rhythm, S1S2 heard, no murmurs appreciated Pulm: clear to auscultation bilaterally, no wheezes, rhonchi or rales; normal work of breathing on room air Extremities: warm, well perfused, No edema, cyanosis or clubbing; +2 pulses bilaterally Psych: Mood stable, speech normal, affect appropriate, good eye contact  Depression screen [facility] [place] 2/9 04/04/2018 03/21/2018 09/13/2017  Decreased Interest 1 1 1   Down, Depressed, Hopeless 1 1 1   PHQ - 2 Score 2 2 2   Altered sleeping 3 3 2   Tired, decreased energy 2 3 2   Change in appetite 0 2 1  Feeling bad or failure about yourself  1 1 0  Trouble concentrating 1 1 1   Moving slowly or fidgety/restless 1 0 0  Suicidal thoughts 0 0 0  PHQ-9 Score 10 12 8   Difficult doing work/chores Somewhat difficult - Not difficult at all    GAD 7 : Generalized Anxiety Score 09/13/2017 07/24/2017  Nervous, Anxious, on Edge 3 1  Control/stop worrying 3 1  Worry too much - different things 2 1  Trouble relaxing 2 2  Restless 2 3  Easily annoyed or irritable 2 1  Afraid - awful might happen - 0  Total GAD 7 Score - 9  Anxiety Difficulty Somewhat difficult -    Assessment/ Plan: 43 y.o. femalehere for follow up weight management visit.  1. Morbid obesity (HCC) 5# weight loss. Doing well with lifestyle modification.  To initiate walking program during lunch/ breaks.  Hydrate.  Follow up in 2 months, sooner if needed. - phentermine (ADIPEX-P) 37.5 MG tablet; Take 0.5-1 tablets (18.75-37.5 mg total) by mouth daily before breakfast.  Dispense: 30 tablet; Refill: 1  The Narcotic Database has been reviewed.  There were no red flags.    Raliegh Ip,  DO [facility] [phone]

## 2018-05-13 ENCOUNTER — Other Ambulatory Visit: Payer: Self-pay | Admitting: Family Medicine

## 2018-07-07 ENCOUNTER — Other Ambulatory Visit: Payer: Self-pay

## 2018-07-07 ENCOUNTER — Ambulatory Visit (INDEPENDENT_AMBULATORY_CARE_PROVIDER_SITE_OTHER): Payer: Self-pay | Admitting: Family Medicine

## 2018-07-07 DIAGNOSIS — F329 Major depressive disorder, single episode, unspecified: Secondary | ICD-10-CM

## 2018-07-07 DIAGNOSIS — Z720 Tobacco use: Secondary | ICD-10-CM

## 2018-07-07 DIAGNOSIS — F410 Panic disorder [episodic paroxysmal anxiety] without agoraphobia: Secondary | ICD-10-CM

## 2018-07-07 DIAGNOSIS — F32A Depression, unspecified: Secondary | ICD-10-CM

## 2018-07-07 DIAGNOSIS — F411 Generalized anxiety disorder: Secondary | ICD-10-CM

## 2018-07-07 DIAGNOSIS — E669 Obesity, unspecified: Secondary | ICD-10-CM

## 2018-07-07 MED ORDER — BUPROPION HCL ER (XL) 150 MG PO TB24: 150.0000 mg | ORAL_TABLET | Freq: Every day | ORAL | 2 refills | Status: DC

## 2018-07-07 MED ORDER — PAROXETINE HCL 40 MG PO TABS: 40.0000 mg | ORAL_TABLET | Freq: Every day | ORAL | 1 refills | Status: DC

## 2018-07-07 NOTE — Progress Notes (Signed)
Telephone visit  Subjective: CC: Follow-up weight PCP: Raliegh IpGottschalk, Ashly M, DO ZOX:WRUEHPI:Christy Howell is a 43 y.o. female calls for telephone consult today. Patient provides verbal consent for consult held via phone.  Location of patient: Work Location of provider: [facility] Others present for call: None  1.  Obesity Patient reports that she has been compliant with phentermine but not found it especially helpful.  Her weight has remained about the same though she reports she has not weighed herself recently.  Her diet has essentially remained the same.  She does not eat at scheduled intervals because her work often gets in the way.  She would like to try something else for weight loss  2.  Tobacco use disorder Patient smokes daily.  No hemoptysis or shortness of breath.  She is interested in quitting.  3.  Generalized anxiety disorder/depressive disorder Patient reports compliance with Paxil 40 mg daily.  She does feel that depression is a little worse, particularly since the COVID-19 outbreak.  No SI or HI.  She has difficulty with sleep and energy.   ROS: Per HPI  Allergies  Allergen Reactions  . Kiwi Extract     Throat itches and hives  . Strawberry Extract Hives    blistering  . Penicillins Rash   Past Medical History:  Diagnosis Date  . Anxiety   . Arthritis   . Carpal tunnel syndrome on both sides   . GERD (gastroesophageal reflux disease)     Current Outpatient Medications:  .  levonorgestrel (MIRENA, 52 MG,) 20 MCG/24HR IUD, 20 mcg by Intrauterine route once., Disp: , Rfl:  .  LORazepam (ATIVAN) 0.5 MG tablet, Take 1 tablet (0.5 mg total) by mouth every 6 (six) hours as needed for anxiety., Disp: 6 tablet, Rfl: 0 .  omeprazole (PRILOSEC) 40 MG capsule, Take 1 capsule (40 mg total) by mouth daily., Disp: 30 capsule, Rfl: 1 .  PARoxetine (PAXIL) 40 MG tablet, Take 1 tablet by mouth once daily, Disp: 90 tablet, Rfl: 0 .  phentermine (ADIPEX-P) 37.5 MG tablet, Take 0.5-1  tablets (18.75-37.5 mg total) by mouth daily before breakfast., Disp: 30 tablet, Rfl: 1  [facility]2018 04/04/2018 03/21/2018  Decreased Interest 1 1 1   Down, Depressed, Hopeless 2 1 1   PHQ - 2 Score 3 2 2   Altered sleeping 3 3 3   Tired, decreased energy 3 2 3   Change in appetite 3 0 2  Feeling bad or failure about yourself  2 1 1   Trouble concentrating 3 1 1   Moving slowly or fidgety/restless 0 1 0  Suicidal thoughts 0 0 0  PHQ-9 Score 17 10 12   Difficult doing work/chores Very difficult Somewhat difficult -  Assessment/ Plan: 43 y.o. female   1. Depressive disorder Not controlled.  We discussed options including addition of Wellbutrin.  I think that this would serve patient well and help with weight loss as well as tobacco cessation.  Continue Paxil daily.  We will follow-up in the next 6 to 8 weeks. - buPROPion (WELLBUTRIN XL) 150 MG 24 hr tablet; Take 1 tablet (150 mg total) by mouth daily.  Dispense: 30 tablet; Refill: 2 - PARoxetine (PAXIL) 40 MG tablet; Take 1 tablet (40 mg total) by mouth daily.  Dispense: 90 tablet; Refill: 1  2. Generalized anxiety disorder with panic attacks As above - buPROPion (WELLBUTRIN XL) 150 MG 24 hr tablet; Take 1 tablet (150 mg total) by mouth daily.  Dispense: 30 tablet; Refill: 2 - PARoxetine (  PAXIL) 40 MG tablet; Take 1 tablet (40 mg total) by mouth daily.  Dispense: 90 tablet; Refill: 1  3. Tobacco use Action phase of cessation - buPROPion (WELLBUTRIN XL) 150 MG 24 hr tablet; Take 1 tablet (150 mg total) by mouth daily.  Dispense: 30 tablet; Refill: 2  4. Obesity (BMI 30-39.9) Refractory to phentermine.  Add Wellbutrin for mood, smoking cessation and hopefully weight loss.  We discussed alternatives including Victoza but I find that this will likely be unaffordable given patient's lack of insurance. - buPROPion (WELLBUTRIN XL) 150 MG 24 hr tablet; Take 1 tablet (150 mg total) by mouth daily.  Dispense: 30 tablet; Refill:  2   Start time: 1:46pm End time: 1:54pm  Total time spent on patient care (including telephone call/ virtual visit): 22 minutes  Ashly Hulen Skains, DO [facility] [phone]

## 2019-03-02 ENCOUNTER — Other Ambulatory Visit: Payer: Self-pay | Admitting: Family Medicine

## 2019-03-02 DIAGNOSIS — F410 Panic disorder [episodic paroxysmal anxiety] without agoraphobia: Secondary | ICD-10-CM

## 2019-03-02 DIAGNOSIS — F32A Depression, unspecified: Secondary | ICD-10-CM

## 2019-03-02 DIAGNOSIS — E669 Obesity, unspecified: Secondary | ICD-10-CM

## 2019-03-02 DIAGNOSIS — F329 Major depressive disorder, single episode, unspecified: Secondary | ICD-10-CM

## 2019-03-02 DIAGNOSIS — Z720 Tobacco use: Secondary | ICD-10-CM

## 2019-03-09 ENCOUNTER — Other Ambulatory Visit: Payer: Self-pay

## 2019-03-09 ENCOUNTER — Ambulatory Visit: Payer: Self-pay | Attending: Internal Medicine

## 2019-03-09 DIAGNOSIS — Z20822 Contact with and (suspected) exposure to covid-19: Secondary | ICD-10-CM

## 2019-03-10 LAB — NOVEL CORONAVIRUS, NAA: SARS-CoV-2, NAA: DETECTED — AB

## 2019-04-03 ENCOUNTER — Telehealth: Payer: Self-pay | Admitting: *Deleted

## 2019-04-03 NOTE — Telephone Encounter (Signed)
Patient called in stating that she feels like she has a 5lb wight sitting on chest, sob and fatigue. She states it feels like she just can not catch her breath.  Patient advised to be evaluated by ER. Patient in agreement.

## 2019-04-21 ENCOUNTER — Other Ambulatory Visit: Payer: Self-pay | Admitting: Family Medicine

## 2019-04-21 DIAGNOSIS — F410 Panic disorder [episodic paroxysmal anxiety] without agoraphobia: Secondary | ICD-10-CM

## 2019-04-21 DIAGNOSIS — F411 Generalized anxiety disorder: Secondary | ICD-10-CM

## 2019-04-21 DIAGNOSIS — F32A Depression, unspecified: Secondary | ICD-10-CM

## 2019-04-21 DIAGNOSIS — F329 Major depressive disorder, single episode, unspecified: Secondary | ICD-10-CM

## 2019-04-21 NOTE — Telephone Encounter (Signed)
30 day supply given and pt scheduled with Dr Reece Agar 05/11/19 at 3:45.

## 2019-05-02 ENCOUNTER — Other Ambulatory Visit: Payer: Self-pay | Admitting: Family Medicine

## 2019-05-02 DIAGNOSIS — F410 Panic disorder [episodic paroxysmal anxiety] without agoraphobia: Secondary | ICD-10-CM

## 2019-05-02 DIAGNOSIS — F329 Major depressive disorder, single episode, unspecified: Secondary | ICD-10-CM

## 2019-05-02 DIAGNOSIS — E669 Obesity, unspecified: Secondary | ICD-10-CM

## 2019-05-02 DIAGNOSIS — F32A Depression, unspecified: Secondary | ICD-10-CM

## 2019-05-02 DIAGNOSIS — Z720 Tobacco use: Secondary | ICD-10-CM

## 2019-05-11 ENCOUNTER — Other Ambulatory Visit: Payer: Self-pay

## 2019-05-11 ENCOUNTER — Ambulatory Visit (INDEPENDENT_AMBULATORY_CARE_PROVIDER_SITE_OTHER): Payer: Self-pay | Admitting: Family Medicine

## 2019-05-11 VITALS — BP 115/70 | HR 82 | Temp 99.6°F | Ht 60.0 in | Wt 203.8 lb

## 2019-05-11 DIAGNOSIS — E669 Obesity, unspecified: Secondary | ICD-10-CM

## 2019-05-11 DIAGNOSIS — F32A Depression, unspecified: Secondary | ICD-10-CM

## 2019-05-11 DIAGNOSIS — R5383 Other fatigue: Secondary | ICD-10-CM

## 2019-05-11 DIAGNOSIS — F410 Panic disorder [episodic paroxysmal anxiety] without agoraphobia: Secondary | ICD-10-CM

## 2019-05-11 DIAGNOSIS — F329 Major depressive disorder, single episode, unspecified: Secondary | ICD-10-CM

## 2019-05-11 DIAGNOSIS — Z720 Tobacco use: Secondary | ICD-10-CM

## 2019-05-11 DIAGNOSIS — F411 Generalized anxiety disorder: Secondary | ICD-10-CM

## 2019-05-11 MED ORDER — BUPROPION HCL ER (XL) 150 MG PO TB24: 150.0000 mg | ORAL_TABLET | Freq: Every day | ORAL | 3 refills | Status: DC

## 2019-05-11 MED ORDER — PAROXETINE HCL 40 MG PO TABS: 40.0000 mg | ORAL_TABLET | Freq: Every day | ORAL | 3 refills | Status: DC

## 2019-05-11 MED ORDER — BUSPIRONE HCL 10 MG PO TABS: ORAL_TABLET | ORAL | 3 refills | Status: AC

## 2019-05-11 NOTE — Patient Instructions (Signed)
Ok to go up to 2 tablets of the bupropion per day.  If this is helping after a week or so, let me know and I will change the prescription.  I have added Buspar for the anxiety.  Message me in about 4 weeks to let me know how these changes are going.

## 2019-05-11 NOTE — Progress Notes (Signed)
Subjective: CC: f/u depression/ anxiety PCP: Janora Norlander, DO Christy Howell All is Howell 44 y.o. female presenting to clinic today for:  1.  Depression/anxiety Patient reports that she has not noticed Howell huge difference with Wellbutrin XL 150 mg daily.  She continues to have intermittent "manic symptoms".  She describes these as feeling anxious and moody and easily set off by things like the house not being in order.  She does report quite Howell bit of stress at work citing that she has been working on some of her days off efforts to keep things moving.  She is had difficulty hiring folks lately.  She does report some excessive daytime sleepiness.  She cites that recently she slept 23 out of 24 hours.  She was not sure if this was related to recent Covid infection versus related to uncontrolled depression.  She notes that the sleepiness just seems worse since Covid but was actually present prior to Covid infection.  She is never been evaluated for obstructive sleep apnea.   ROS: Per HPI  Allergies  Allergen Reactions  . Kiwi Extract     Throat itches and hives  . Strawberry Extract Hives    blistering  . Penicillins Rash   Past Medical History:  Diagnosis Date  . Anxiety   . Arthritis   . Carpal tunnel syndrome on both sides   . GERD (gastroesophageal reflux disease)     Current Outpatient Medications:  .  levonorgestrel (MIRENA, 52 MG,) 20 MCG/24HR IUD, 20 mcg by Intrauterine route once., Disp: , Rfl:  .  omeprazole (PRILOSEC) 40 MG capsule, Take 1 capsule (40 mg total) by mouth daily., Disp: 30 capsule, Rfl: 1 .  PARoxetine (PAXIL) 40 MG tablet, Take 1 tablet (40 mg total) by mouth daily. Need appointment for further refills, Disp: 30 tablet, Rfl: 0 .  buPROPion (WELLBUTRIN XL) 150 MG 24 hr tablet, Take 1 tablet by mouth once daily (Patient not taking: Reported on 05/11/2019), Disp: 30 tablet, Rfl: 0 .  LORazepam (ATIVAN) 0.5 MG tablet, Take 1 tablet (0.5 mg total) by mouth every 6  (six) hours as needed for anxiety. (Patient not taking: Reported on 05/11/2019), Disp: 6 tablet, Rfl: 0 Social History   Socioeconomic History  . Marital status: Married    Spouse name: Not on file  . Number of children: 3  . Years of education: Not on file  . Highest education level: Not on file  Occupational History  . Occupation: runs [employer]  Tobacco Use  . Smoking status: Current Every Day Smoker    Packs/day: 1.00    Years: 25.00    Pack years: 25.00    Types: Cigarettes  . Smokeless tobacco: Never Used  . Tobacco comment: tobacco info given  Substance and Sexual Activity  . Alcohol use: No  . Drug use: No  . Sexual activity: Yes    Birth control/protection: Surgical, I.U.D.    Comment: IUD due for removal 2020  Other Topics Concern  . Not on file  Social History Narrative   Married, 3 children   Right handed   12 th grade   4-5 16 oz diet sodas daily   Social Determinants of Health   Financial Resource Strain:   . Difficulty of Paying Living Expenses: Not on file  Food Insecurity:   . Worried About Charity fundraiser in the Last Year: Not on file  . Ran Out of Food in the Last Year: Not on file  Transportation Needs:   . Freight forwarder (Medical): Not on file  . Lack of Transportation (Non-Medical): Not on file  Physical Activity:   . Days of Exercise per Week: Not on file  . Minutes of Exercise per Session: Not on file  Stress:   . Feeling of Stress : Not on file  Social Connections:   . Frequency of Communication with Friends and Family: Not on file  . Frequency of Social Gatherings with Friends and Family: Not on file  . Attends Religious Services: Not on file  . Active Member of Clubs or Organizations: Not on file  . Attends Banker Meetings: Not on file  . Marital Status: Not on file  Intimate Partner Violence:   . Fear of Current or Ex-Partner: Not on file  . Emotionally Abused: Not on file  . Physically Abused: Not on file   . Sexually Abused: Not on file   Family History  Problem Relation Age of Onset  . Rectal cancer Maternal Grandmother   . Colon cancer Neg Hx   . Esophageal cancer Neg Hx     Objective: Office vital signs reviewed. BP 115/70   Pulse 82   Temp 99.6 F (37.6 C) (Temporal)   Ht 5' (1.524 m)   Wt 203 lb 12.8 oz (92.4 kg)   BMI 39.80 kg/m   Physical Examination:  General: Awake, alert, obese, No acute distress HEENT: Normal; no goiter.  No exophthalmos Cardio: regular rate and rhythm, S1S2 heard, no murmurs appreciated Pulm: clear to auscultation bilaterally, no wheezes, rhonchi or rales; normal work of breathing on room air Psych: Mood stable, speech normal, affect appropriate, pleasant, interactive, good eye contact Depression screen [facility] 2/9 05/11/2019 07/07/2018 04/04/2018  Decreased Interest 3 1 1   Down, Depressed, Hopeless 2 2 1   PHQ - 2 Score 5 3 2   Altered sleeping 3 3 3   Tired, decreased energy 3 3 2   Change in appetite 1 3 0  Feeling bad or failure about yourself  1 2 1   Trouble concentrating 2 3 1   Moving slowly or fidgety/restless 2 0 1  Suicidal thoughts 0 0 0  PHQ-9 Score 17 17 10   Difficult doing work/chores - Very difficult Somewhat difficult   GAD 7 : Generalized Anxiety Score 05/11/2019 09/13/2017 07/24/2017  Nervous, Anxious, on Edge 2 3 1   Control/stop worrying 2 3 1   Worry too much - different things 2 2 1   Trouble relaxing 2 2 2   Restless 3 2 3   Easily annoyed or irritable 3 2 1   Afraid - awful might happen 0 - 0  Total GAD 7 Score 14 - 9  Anxiety Difficulty - Somewhat difficult -      Assessment/ Plan: 44 y.o. female   1. Generalized anxiety disorder with panic attacks Not controlled.  Will add buspirone.  Taper discussed with patient.  Continue Paxil.  May increase Wellbutrin to 300 mg daily if needed for depression.  She will reach out to me on how she responds to these medications and we will titrate accordingly. - buPROPion (WELLBUTRIN XL) 150  MG 24 hr tablet; Take 1 tablet (150 mg total) by mouth daily.  Dispense: 90 tablet; Refill: 3 - PARoxetine (PAXIL) 40 MG tablet; Take 1 tablet (40 mg total) by mouth daily.  Dispense: 90 tablet; Refill: 3 - busPIRone (BUSPAR) 10 MG tablet; Take 0.5 tablets (5 mg total) by mouth 2 (two) times daily for 7 days, THEN 1 tablet (10 mg total) 2 (  two) times daily.  Dispense: 180 tablet; Refill: 3  2. Depressive disorder - buPROPion (WELLBUTRIN XL) 150 MG 24 hr tablet; Take 1 tablet (150 mg total) by mouth daily.  Dispense: 90 tablet; Refill: 3 - PARoxetine (PAXIL) 40 MG tablet; Take 1 tablet (40 mg total) by mouth daily.  Dispense: 90 tablet; Refill: 3  3. Tobacco use - buPROPion (WELLBUTRIN XL) 150 MG 24 hr tablet; Take 1 tablet (150 mg total) by mouth daily.  Dispense: 90 tablet; Refill: 3  4. Obesity (BMI 30-39.9) - buPROPion (WELLBUTRIN XL) 150 MG 24 hr tablet; Take 1 tablet (150 mg total) by mouth daily.  Dispense: 90 tablet; Refill: 3  5. Fatigue, unspecified type We discussed the possibility of obstructive sleep apnea contributing, this could certainly worsen her depressive and anxiety symptoms.  May need to consider at home sleep study given lack of insurance.  She will reach out to me if she decides to do this prior to her next visit.   No orders of the defined types were placed in this encounter.  No orders of the defined types were placed in this encounter.    Raliegh Ip, DO [facility] [phone]

## 2019-06-01 ENCOUNTER — Telehealth (INDEPENDENT_AMBULATORY_CARE_PROVIDER_SITE_OTHER): Payer: Self-pay | Admitting: Family Medicine

## 2019-06-01 DIAGNOSIS — Z5329 Procedure and treatment not carried out because of patient's decision for other reasons: Secondary | ICD-10-CM

## 2019-06-01 DIAGNOSIS — Z91199 Patient's noncompliance with other medical treatment and regimen due to unspecified reason: Secondary | ICD-10-CM

## 2019-06-01 NOTE — Progress Notes (Signed)
Sent link for visit at 1315 and 1320, pt did not join. Will try again later.   Kari Baars, FNP-C   Sent link again at 1340, no answer by 1345.   Kari Baars, FNP-C   Sent link to visit at 1440, no answer by 1455.   Kari Baars, FNP-C  Sent link at 1650, no answer by 1655. Pt will need to reschedule.   Kari Baars, FNP-C [facility] [address] [place], [place] [address] [phone]

## 2019-07-13 ENCOUNTER — Encounter: Payer: Self-pay | Admitting: Family Medicine

## 2019-07-17 ENCOUNTER — Ambulatory Visit: Payer: Self-pay | Admitting: Orthopaedic Surgery

## 2019-08-03 IMAGING — DX DG CHEST 2V
2 series · 2 of 2 positions shown · non-contrast
Comparison: None.

CLINICAL DATA: Shortness of breath

EXAM:
CHEST - 2 VIEW

[chest pa]
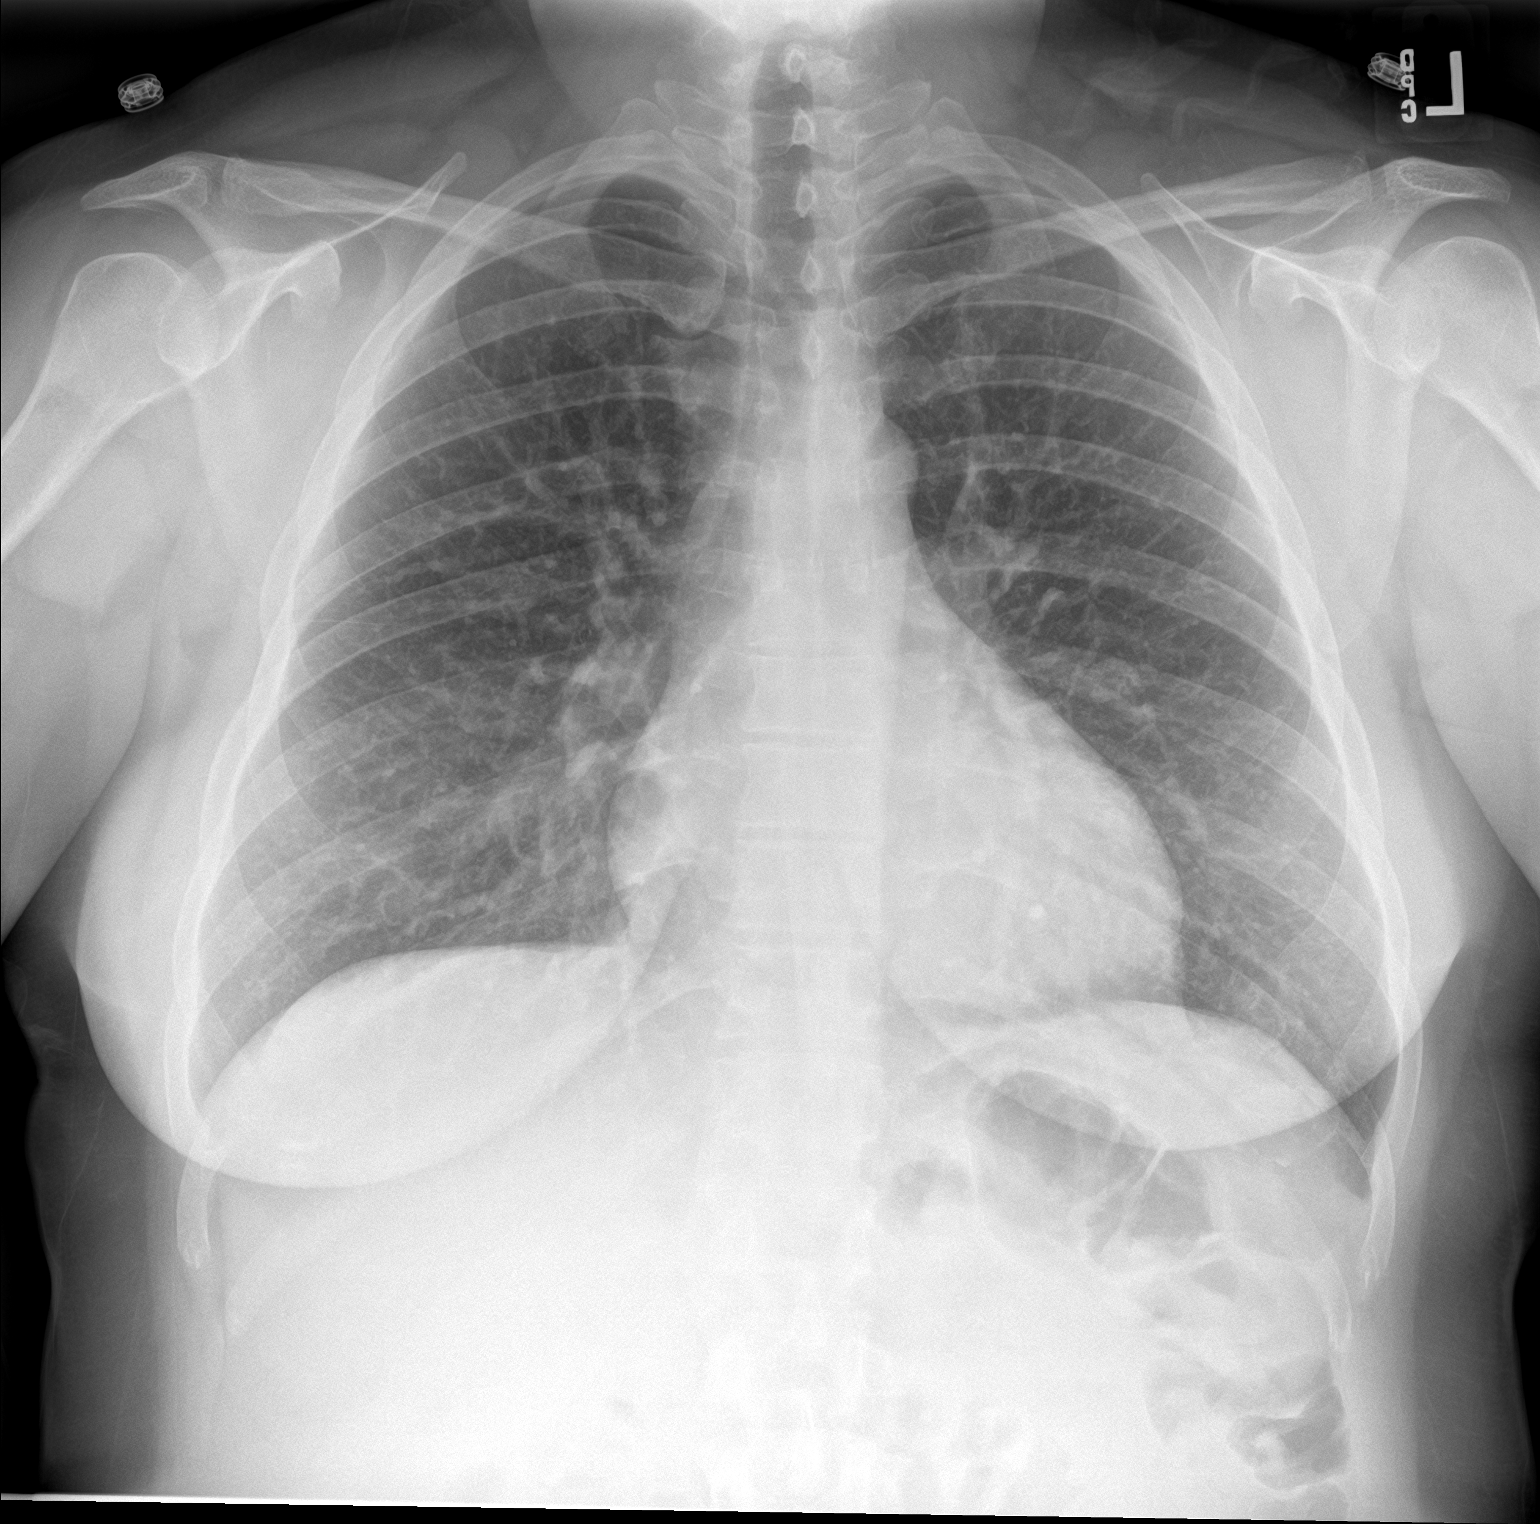

[chest lat]
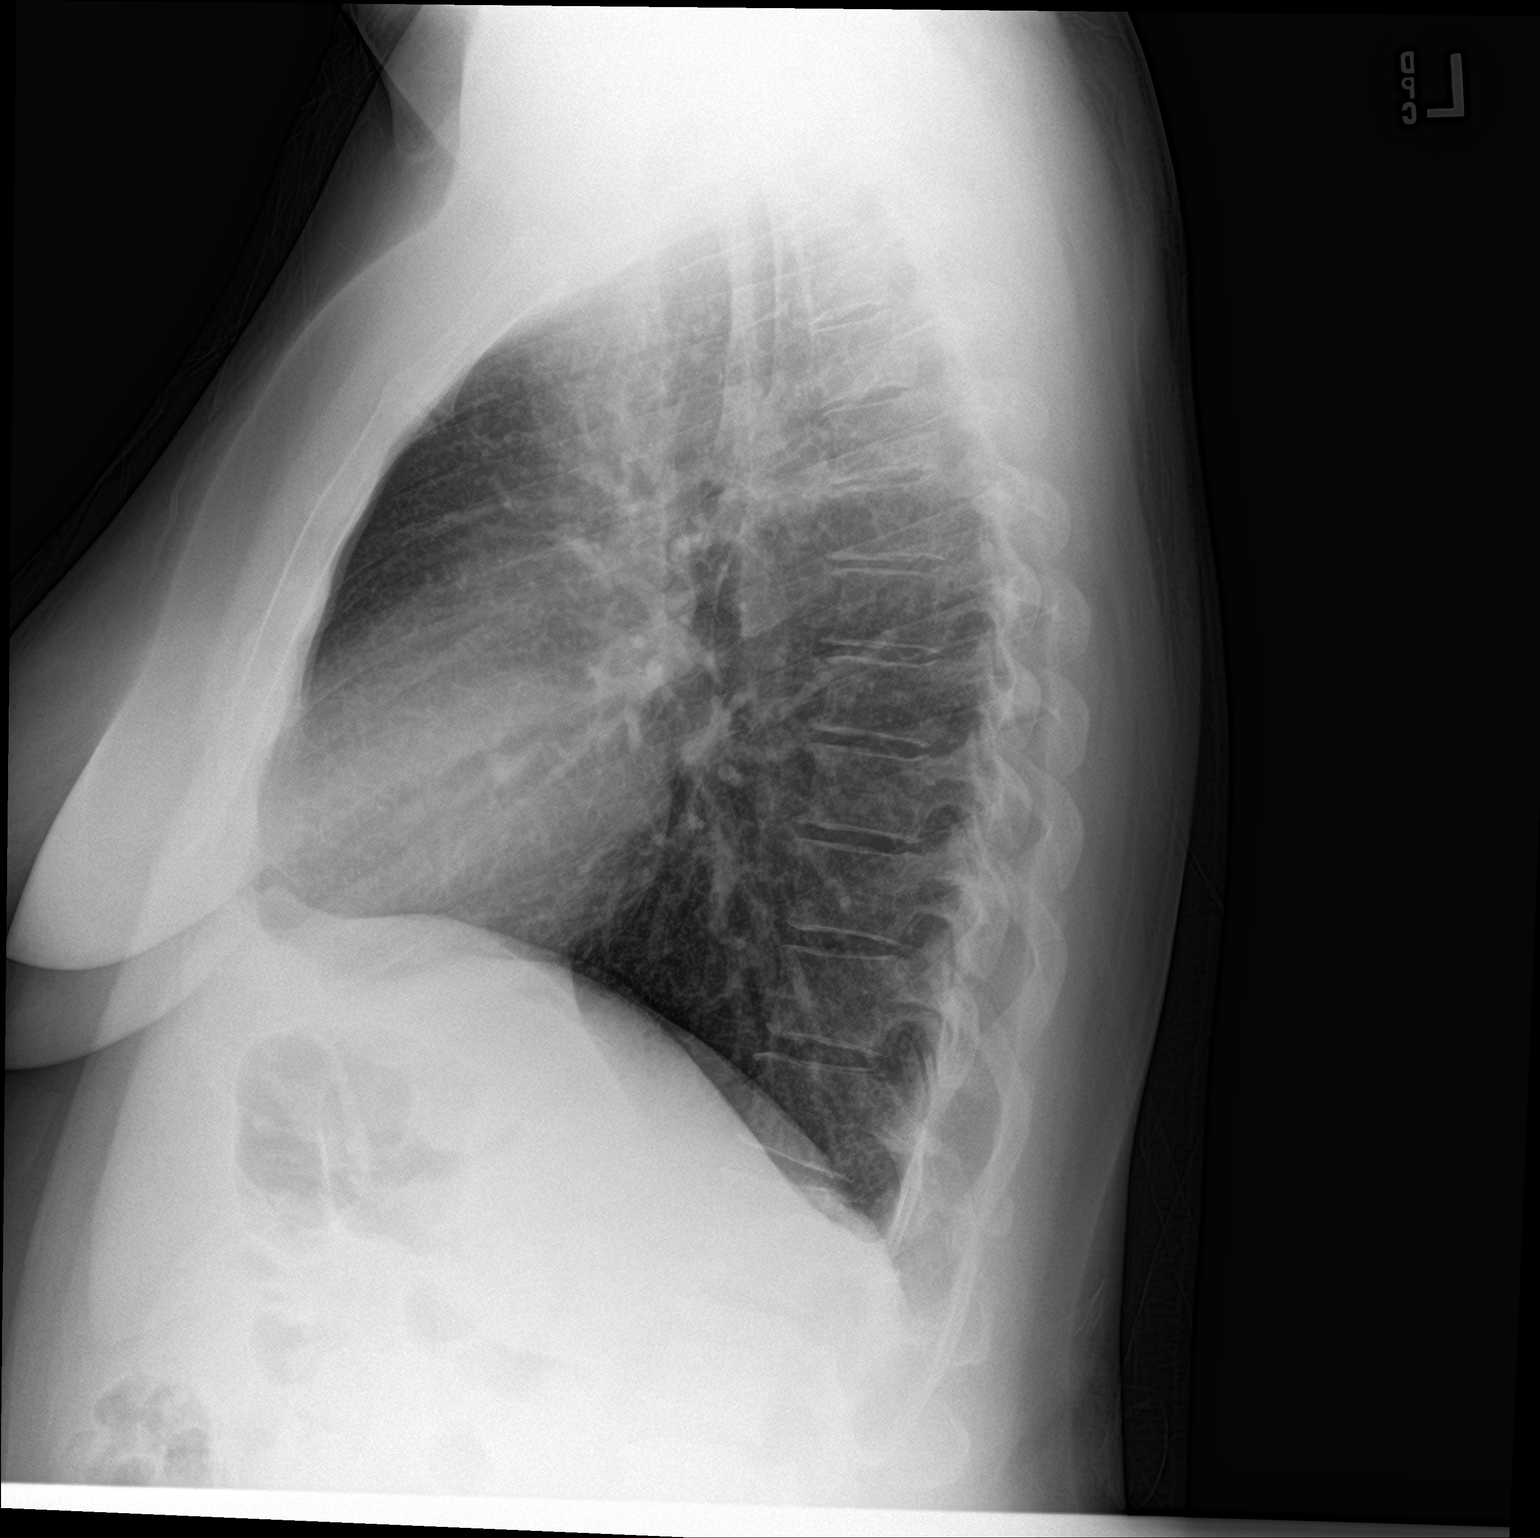

[2 of 2 positions shown; findings below may reference images not displayed]

FINDINGS: Normal heart size and mediastinal contours. No acute infiltrate or
edema. No effusion or pneumothorax. No acute osseous findings.
IMPRESSION: Negative chest.

## 2019-11-19 ENCOUNTER — Ambulatory Visit: Payer: Self-pay | Attending: Internal Medicine

## 2019-11-19 DIAGNOSIS — Z23 Encounter for immunization: Secondary | ICD-10-CM

## 2019-11-19 NOTE — Progress Notes (Signed)
   Covid-19 Vaccination Clinic  Name:  HALEN MOSSBARGER    MRN: [id number] DOB: 03/01/1976  11/19/2019  Ms. Godown was observed post Covid-19 immunization for 15 minutes without incident. She was provided with Vaccine Information Sheet and instruction to access the V-Safe system.   Ms. Sonntag was instructed to call 911 with any severe reactions post vaccine: Marland Kitchen Difficulty breathing  . Swelling of face and throat  . A fast heartbeat  . A bad rash all over body  . Dizziness and weakness   Immunizations Administered    Name Date Dose VIS Date Route   Pfizer COVID-19 Vaccine 11/19/2019  4:20 PM 0.3 mL 04/29/2018 Intramuscular   Manufacturer: [employer], [employer]   Lot: [id number]   NDC: [id number]

## 2019-12-07 ENCOUNTER — Ambulatory Visit: Payer: Self-pay | Admitting: Family Medicine

## 2019-12-10 ENCOUNTER — Ambulatory Visit: Payer: Self-pay | Attending: Internal Medicine

## 2019-12-10 DIAGNOSIS — Z23 Encounter for immunization: Secondary | ICD-10-CM

## 2019-12-10 NOTE — Progress Notes (Signed)
   Covid-19 Vaccination Clinic  Name:  Christy Howell    MRN: [id number] DOB: Apr 19, 1975  12/10/2019  Christy Howell was observed post Covid-19 immunization for 15 minutes without incident. She was provided with Vaccine Information Sheet and instruction to access the V-Safe system.   Christy Howell was instructed to call 911 with any severe reactions post vaccine: Marland Kitchen Difficulty breathing  . Swelling of face and throat  . A fast heartbeat  . A bad rash all over body  . Dizziness and weakness   Immunizations Administered    Name Date Dose VIS Date Route   Pfizer COVID-19 Vaccine 12/10/2019  3:35 PM 0.3 mL 04/29/2018 Intramuscular   Manufacturer: [employer], [employer]   Lot: [id number]   NDC: [address]-6

## 2020-08-09 ENCOUNTER — Other Ambulatory Visit: Payer: Self-pay | Admitting: Family Medicine

## 2020-08-09 DIAGNOSIS — E669 Obesity, unspecified: Secondary | ICD-10-CM

## 2020-08-09 DIAGNOSIS — F329 Major depressive disorder, single episode, unspecified: Secondary | ICD-10-CM

## 2020-08-09 DIAGNOSIS — Z720 Tobacco use: Secondary | ICD-10-CM

## 2020-08-09 DIAGNOSIS — F32A Depression, unspecified: Secondary | ICD-10-CM

## 2020-08-09 DIAGNOSIS — F410 Panic disorder [episodic paroxysmal anxiety] without agoraphobia: Secondary | ICD-10-CM

## 2020-08-09 NOTE — Telephone Encounter (Signed)
Refills denied - NTBS for further rf.

## 2020-08-13 ENCOUNTER — Other Ambulatory Visit: Payer: Self-pay | Admitting: Nurse Practitioner

## 2020-08-13 ENCOUNTER — Other Ambulatory Visit: Payer: Self-pay | Admitting: Family Medicine

## 2020-08-13 DIAGNOSIS — Z720 Tobacco use: Secondary | ICD-10-CM

## 2020-08-13 DIAGNOSIS — F32A Depression, unspecified: Secondary | ICD-10-CM

## 2020-08-13 DIAGNOSIS — F410 Panic disorder [episodic paroxysmal anxiety] without agoraphobia: Secondary | ICD-10-CM

## 2020-08-13 DIAGNOSIS — E669 Obesity, unspecified: Secondary | ICD-10-CM

## 2020-08-13 MED ORDER — PAROXETINE HCL 40 MG PO TABS: 40.0000 mg | ORAL_TABLET | ORAL | 1 refills | Status: DC

## 2020-08-24 ENCOUNTER — Ambulatory Visit (INDEPENDENT_AMBULATORY_CARE_PROVIDER_SITE_OTHER): Payer: Self-pay | Admitting: Family Medicine

## 2020-08-24 ENCOUNTER — Encounter: Payer: Self-pay | Admitting: Family Medicine

## 2020-08-24 ENCOUNTER — Other Ambulatory Visit: Payer: Self-pay

## 2020-08-24 VITALS — BP 120/78 | HR 70 | Temp 97.5°F | Ht 60.0 in | Wt 182.4 lb

## 2020-08-24 DIAGNOSIS — Z720 Tobacco use: Secondary | ICD-10-CM

## 2020-08-24 DIAGNOSIS — F411 Generalized anxiety disorder: Secondary | ICD-10-CM

## 2020-08-24 DIAGNOSIS — F410 Panic disorder [episodic paroxysmal anxiety] without agoraphobia: Secondary | ICD-10-CM

## 2020-08-24 DIAGNOSIS — R1033 Periumbilical pain: Secondary | ICD-10-CM

## 2020-08-24 DIAGNOSIS — F32A Depression, unspecified: Secondary | ICD-10-CM

## 2020-08-24 DIAGNOSIS — F5104 Psychophysiologic insomnia: Secondary | ICD-10-CM

## 2020-08-24 DIAGNOSIS — F329 Major depressive disorder, single episode, unspecified: Secondary | ICD-10-CM

## 2020-08-24 DIAGNOSIS — E669 Obesity, unspecified: Secondary | ICD-10-CM

## 2020-08-24 LAB — CBC
Hematocrit: 38.8 % (ref 34.0–46.6)
Hemoglobin: 13.1 g/dL (ref 11.1–15.9)
MCH: 30.2 pg (ref 26.6–33.0)
MCHC: 33.8 g/dL (ref 31.5–35.7)
MCV: 89 fL (ref 79–97)
Platelets: 325 10*3/uL (ref 150–450)
RBC: 4.34 x10E6/uL (ref 3.77–5.28)
RDW: 11.7 % (ref 11.7–15.4)
WBC: 6.6 10*3/uL (ref 3.4–10.8)

## 2020-08-24 LAB — CMP14+EGFR
ALT: 31 IU/L (ref 0–32)
AST: 16 IU/L (ref 0–40)
Albumin/Globulin Ratio: 2.2 (ref 1.2–2.2)
Albumin: 4.6 g/dL (ref 3.8–4.8)
Alkaline Phosphatase: 52 IU/L (ref 44–121)
BUN/Creatinine Ratio: 11 (ref 9–23)
BUN: 9 mg/dL (ref 6–24)
Bilirubin Total: 0.3 mg/dL (ref 0.0–1.2)
CO2: 25 mmol/L (ref 20–29)
Calcium: 9.4 mg/dL (ref 8.7–10.2)
Chloride: 102 mmol/L (ref 96–106)
Creatinine, Ser: 0.79 mg/dL (ref 0.57–1.00)
Globulin, Total: 2.1 g/dL (ref 1.5–4.5)
Glucose: 113 mg/dL — ABNORMAL HIGH (ref 65–99)
Potassium: 4 mmol/L (ref 3.5–5.2)
Sodium: 141 mmol/L (ref 134–144)
Total Protein: 6.7 g/dL (ref 6.0–8.5)
eGFR: 95 mL/min/{1.73_m2} (ref >59)

## 2020-08-24 MED ORDER — BUPROPION HCL ER (XL) 150 MG PO TB24: 150.0000 mg | ORAL_TABLET | Freq: Every day | ORAL | 3 refills | Status: DC

## 2020-08-24 MED ORDER — BUSPIRONE HCL 15 MG PO TABS: 15.0000 mg | ORAL_TABLET | Freq: Two times a day (BID) | ORAL | 3 refills | Status: DC

## 2020-08-24 MED ORDER — PAROXETINE HCL 40 MG PO TABS: 40.0000 mg | ORAL_TABLET | ORAL | 3 refills | Status: DC

## 2020-08-24 MED ORDER — HYDROXYZINE HCL 50 MG PO TABS: 25.0000 mg | ORAL_TABLET | Freq: Every evening | ORAL | 3 refills | Status: DC | PRN

## 2020-08-24 NOTE — Patient Instructions (Addendum)
I suspect small hernia causing abdominal pain but could not feel a defect.  Your last CT scan did show a small umbilical hernia.  If you develop nausea, vomiting, change in appetite or bowel habits OR fevers, please seek immediate medical attention.  Umbilical Hernia, Adult  A hernia is a bulge of tissue that pushes through an opening between muscles. An umbilical hernia happens in the abdomen, near the belly button (umbilicus). The hernia may contain tissues from the small intestine, large intestine, or fatty tissue covering the intestines (omentum). Umbilical hernias in adults tend to get worse over time, and they requiresurgical treatment. There are several types of umbilical hernias. You may have: A hernia located just above or below the umbilicus (indirect hernia). This is the most common type of umbilical hernia in adults. A hernia that forms through an opening formed by the umbilicus (direct hernia). A hernia that comes and goes (reducible hernia). A reducible hernia may be visible only when you strain, lift something heavy, or cough. This type of hernia can be pushed back into the abdomen (reduced). A hernia that traps abdominal tissue inside the hernia (incarcerated hernia). This type of hernia cannot be reduced. A hernia that cuts off blood flow to the tissues inside the hernia (strangulated hernia). The tissues can start to die if this happens. This type of hernia requires emergency treatment. What are the causes? An umbilical hernia happens when tissue inside the abdomen presses on a weakarea of the abdominal muscles. What increases the risk? You may have a greater risk of this condition if you: Are obese. Have had several pregnancies. Have a buildup of fluid inside your abdomen (ascites). Have had surgery that weakens the abdominal muscles. What are the signs or symptoms? The main symptom of this condition is a painless bulge at or near the belly button. A reducible hernia may be  visible only when you strain, lift something heavy, or cough. Other symptoms may include: Dull pain. A feeling of pressure. Symptoms of a strangulated hernia may include: Pain that gets increasingly worse. Nausea and vomiting. Pain when pressing on the hernia. Skin over the hernia becoming red or purple. Constipation. Blood in the stool. How is this diagnosed? This condition may be diagnosed based on: A physical exam. You may be asked to cough or strain while standing. These actions increase the pressure inside your abdomen and force the hernia through the opening in your muscles. Your health care provider may try to reduce the hernia by pressing on it. Your symptoms and medical history. How is this treated? Surgery is the only treatment for an umbilical hernia. Surgery for a strangulated hernia is done as soon as possible. If you have a small herniathat is not incarcerated, you may need to lose weight before having surgery. Follow these instructions at home: Lose weight, if told by your health care provider. Do not try to push the hernia back in. Watch your hernia for any changes in color or size. Tell your health care provider if any changes occur. You may need to avoid activities that increase pressure on your hernia. Do not lift anything that is heavier than 10 lb (4.5 kg) until your health care provider says that this is safe. Take over-the-counter and prescription medicines only as told by your health care provider. Keep all follow-up visits as told by your health care provider. This is important. Contact a health care provider if: Your hernia gets larger. Your hernia becomes painful. Get help right away  if: You develop sudden, severe pain near the area of your hernia. You have pain as well as nausea or vomiting. You have pain and the skin over your hernia changes color. You develop a fever. This information is not intended to replace advice given to you by your health care  provider. Make sure you discuss any questions you have with your healthcare provider. Document Revised: 04/03/2017 Document Reviewed: 08/20/2016 Elsevier Patient Education  2021 [employer].

## 2020-08-24 NOTE — Progress Notes (Signed)
Subjective: CC:med refills PCP: Janora Norlander, DO TLX:BWIO Christy Howell is Christy 45 y.o. female presenting to clinic today for:  1.  Anxiety, depression, insomnia Patient reports that she had lapsed on her Paxil and developed withdrawal symptoms.  She did ultimately reach the provider on-call, who graciously refilled her prescription until she is able to be seen in the office.  She did notice Christy huge difference in her mood during this time but also felt quite physically ill.  Since going back on it things seem to be stabilizing.  She needs refills on her Wellbutrin and BuSpar.  She does report increased stress and anxiety at work due to lack of workers.  She runs Christy business locally.  She admits that this anxiety interferes with her sleep and she finds it difficult to fall asleep and often will sleep late as Christy result.  Her husband is accompanying her today's appointment and admits that she does not seem to sleep well when she does sleep, often starting around in her sleep.  He also notes snoring.  Patient has considered getting Christy sleep test but she would not want to use Christy CPAP machine and therefore she is never pursued this.  Of note, she has lost weight over the last year admits that this is because decreased appetite due to stress.  2.  Abdominal pain Patient has been experiencing some periumbilical abdominal pain on the right side for the last few days.  Denies any associated nausea, vomiting, constipation, diarrhea, fevers, blood in stool, blood in urine or new onset back pain.  She does have Christy history of renal stones.  She has her gallbladder and appendix still.   ROS: Per HPI  Allergies  Allergen Reactions   Kiwi Extract     Throat itches and hives   Strawberry Extract Hives    blistering   Penicillins Rash   Past Medical History:  Diagnosis Date   Anxiety    Arthritis    Carpal tunnel syndrome on both sides    GERD (gastroesophageal reflux disease)     Current Outpatient  Medications:    buPROPion (WELLBUTRIN XL) 150 MG 24 hr tablet, Take 1 tablet (150 mg total) by mouth daily., Disp: 90 tablet, Rfl: 3   levonorgestrel (MIRENA, 52 MG,) 20 MCG/24HR IUD, 20 mcg by Intrauterine route once., Disp: , Rfl:    omeprazole (PRILOSEC) 40 MG capsule, Take 1 capsule (40 mg total) by mouth daily., Disp: 30 capsule, Rfl: 1   PARoxetine (PAXIL) 40 MG tablet, Take 1 tablet (40 mg total) by mouth every morning., Disp: 90 tablet, Rfl: 1 Social History   Socioeconomic History   Marital status: Married    Spouse name: Not on file   Number of children: 3   Years of education: Not on file   Highest education level: Not on file  Occupational History   Occupation: runs Christy [employer]  Tobacco Use   Smoking status: Every Day    Packs/day: 1.00    Years: 25.00    Pack years: 25.00    Types: Cigarettes   Smokeless tobacco: Never   Tobacco comments:    tobacco info given  Vaping Use   Vaping Use: Never used  Substance and Sexual Activity   Alcohol use: No   Drug use: No   Sexual activity: Yes    Birth control/protection: Surgical, I.U.D.    Comment: IUD due for removal 2020  Other Topics Concern   Not on file  Social  History Narrative   Married, 3 children   Right handed   12 th grade   4-5 16 oz diet sodas daily   Social Determinants of Health   Financial Resource Strain: Not on file  Food Insecurity: Not on file  Transportation Needs: Not on file  Physical Activity: Not on file  Stress: Not on file  Social Connections: Not on file  Intimate Partner Violence: Not on file   Family History  Problem Relation Age of Onset   Rectal cancer Maternal Grandmother    Colon cancer Neg Hx    Esophageal cancer Neg Hx     Objective: Office vital signs reviewed. BP 120/78   Pulse 70   Temp (!) 97.5 F (36.4 C)   Ht 5' (1.524 m)   Wt 182 lb 6.4 oz (82.7 kg)   SpO2 96%   BMI 35.62 kg/m   Physical Examination:  General: Awake, alert, well nourished, appears  somewhat stressed Cardio: regular rate and rhythm, S1S2 heard, no murmurs appreciated Pulm: clear to auscultation bilaterally, no wheezes, rhonchi or rales; normal work of breathing on room air GI: soft, flat, mild right-sided periumbilical tenderness.  There are no palpable defects or abnormalities in this region.  Otherwise negative tenderness throughout including negative McBurney and negative Murphy sign, non-distended, bowel sounds present x4, no hepatomegaly, no splenomegaly, no masses Extremities: warm, well perfused, No edema, cyanosis or clubbing; +2 pulses bilaterally Psych: Mood somewhat depressed.  Patient peers somewhat anxious and tired.  Her thought process is linear.  She has good eye contact.  She is pleasant and interactive.  Depression screen [facility] 2/9 05/11/2019 07/07/2018 04/04/2018  Decreased Interest _0 Down, Depressed, Hopeless _1 PHQ - 2 Score _2 Altered sleeping _3 Tired, decreased energy _4 Change in appetite 1 3 0  Feeling bad or failure about yourself  _5 Trouble concentrating _6 Moving slowly or fidgety/restless 2 0 1  Suicidal thoughts 0 0 0  PHQ-9 Score _7 Difficult doing work/chores - Very difficult Somewhat difficult   GAD 7 : Generalized Anxiety Score 05/11/2019 09/13/2017 07/24/2017  Nervous, Anxious, on Edge _8 Control/stop worrying _9 Worry too much - different things _10 Trouble relaxing _11 Restless _12 Easily annoyed or irritable _13 Afraid - awful might happen 0 - 0  Total GAD 7 Score 14 - 9  Anxiety Difficulty - Somewhat difficult -    Assessment/ Plan: 45 y.o. female   Generalized anxiety disorder with panic attacks - Plan: CMP14+EGFR, busPIRone (BUSPAR) 15 MG tablet, hydrOXYzine (ATARAX/VISTARIL) 50 MG tablet, buPROPion (WELLBUTRIN XL) 150 MG 24 hr tablet, PARoxetine (PAXIL) 40 MG tablet  Depressive disorder - Plan: CMP14+EGFR, buPROPion (WELLBUTRIN XL) 150 MG 24 hr tablet  Obesity (BMI  30-39.9) - Plan: CMP14+EGFR, buPROPion (WELLBUTRIN XL) 150 MG 24 hr tablet  Tobacco use - Plan: CBC, buPROPion (WELLBUTRIN XL) 150 MG 24 hr tablet  Psychophysiological insomnia - Plan: hydrOXYzine (ATARAX/VISTARIL) 50 MG tablet  Periumbilical abdominal pain  Anxiety disorder is not well controlled.  Have advanced her buspirone to 15 mg twice daily.  She will continue Paxil, Wellbutrin.  I have added Atarax 25 to 100 mg nightly as needed sleep.  Caution sedation and dry mouth.  She has lost weight but I think this is secondary  to extreme stressors and decreased appetite.  Technically she is approaching normal weight but still want to watch this closely  Her periumbilical abdominal pain seems consistent with Christy hernia but I could not appreciate Christy palpable defect.  Other diagnoses considered include constipation, cholecystitis, appendicitis and renal stone.  However, her findings and HPI would not consistent with these differential diagnoses.  Her 2017 CT scan showed Christy small fat-containing umbilical hernia.  We discussed that if symptoms worsen or she develops any other worrisome symptoms or signs she is to seek immediate medical attention, at which point we will obtain repeat abdominal CT and/or refer her to general surgery.  She was good understanding of the plan will follow up accordingly  No orders of the defined types were placed in this encounter.  No orders of the defined types were placed in this encounter.    Janora Norlander, DO [place] [phone]

## 2021-02-01 ENCOUNTER — Ambulatory Visit (INDEPENDENT_AMBULATORY_CARE_PROVIDER_SITE_OTHER): Payer: Self-pay | Admitting: Family Medicine

## 2021-02-01 ENCOUNTER — Encounter: Payer: Self-pay | Admitting: Family Medicine

## 2021-02-01 DIAGNOSIS — J0100 Acute maxillary sinusitis, unspecified: Secondary | ICD-10-CM

## 2021-02-01 MED ORDER — PREDNISONE 20 MG PO TABS: 40.0000 mg | ORAL_TABLET | Freq: Every day | ORAL | 0 refills | Status: AC

## 2021-02-01 MED ORDER — AZITHROMYCIN 250 MG PO TABS: ORAL_TABLET | ORAL | 0 refills | Status: DC

## 2021-02-01 NOTE — Progress Notes (Signed)
   Virtual Visit  Note Due to COVID-19 pandemic this visit was conducted virtually. This visit type was conducted due to national recommendations for restrictions regarding the COVID-19 Pandemic (e.g. social distancing, sheltering in place) in an effort to limit this patient's exposure and mitigate transmission in our community. All issues noted in this document were discussed and addressed.  A physical exam was not performed with this format.  I connected with [name] on 02/01/21 at 1134 by telephone and verified that I am speaking with the correct person using two identifiers. Christy Howell is currently located at home and no one is currently with her during the visit. The provider, Gabriel Earing, FNP is located in their office at time of visit.  I discussed the limitations, risks, security and privacy concerns of performing an evaluation and management service by telephone and the availability of in person appointments. I also discussed with the patient that there may be a patient responsible charge related to this service. The patient expressed understanding and agreed to proceed.  CC: sinusitis  History and Present Illness:  HPI Christy Howell reports congestion for about 1 weeks. She initially also had a fever and cough but these have improved. The head congestion has worsened. She now has a headaches with facial pressure and maxillary tenderness for about 4 days. She denies sore throat, shortness of breath, or chest pain. She has tried alka seltzer cold and flu and mucinex DM without improvement. She has a history of sinusitis and reports she responds well to prednisone and zpak. She has not had either in the last 90 days.      ROS As per HPI.    Observations/Objective: Alert and oriented x 3. Able to speak in full sentences without difficulty.   Assessment and Plan: Christy Howell was seen today for sinusitis.  Diagnoses and all orders for this visit:  Acute non-recurrent maxillary  sinusitis Discussed symptomatic care and return precautions.  -     azithromycin (ZITHROMAX Z-PAK) 250 MG tablet; As directed -     predniSONE (DELTASONE) 20 MG tablet; Take 2 tablets (40 mg total) by mouth daily with breakfast for 5 days.    Follow Up Instructions: As needed.     I discussed the assessment and treatment plan with the patient. The patient was provided an opportunity to ask questions and all were answered. The patient agreed with the plan and demonstrated an understanding of the instructions.   The patient was advised to call back or seek an in-person evaluation if the symptoms worsen or if the condition fails to improve as anticipated.  The above assessment and management plan was discussed with the patient. The patient verbalized understanding of and has agreed to the management plan. Patient is aware to call the clinic if symptoms persist or worsen. Patient is aware when to return to the clinic for a follow-up visit. Patient educated on when it is appropriate to go to the emergency department.   Time call ended:  1146  I provided 12 minutes of  non face-to-face time during this encounter.    Gabriel Earing, FNP

## 2021-02-09 ENCOUNTER — Ambulatory Visit (INDEPENDENT_AMBULATORY_CARE_PROVIDER_SITE_OTHER): Payer: Self-pay | Admitting: Family Medicine

## 2021-02-09 ENCOUNTER — Encounter: Payer: Self-pay | Admitting: Family Medicine

## 2021-02-09 DIAGNOSIS — J0100 Acute maxillary sinusitis, unspecified: Secondary | ICD-10-CM

## 2021-02-09 MED ORDER — CEFDINIR 300 MG PO CAPS: 300.0000 mg | ORAL_CAPSULE | Freq: Two times a day (BID) | ORAL | 0 refills | Status: DC

## 2021-02-09 NOTE — Progress Notes (Signed)
Virtual Visit via telephone Note  I connected with Christy Howell on 02/09/21 at 1649 by telephone and verified that I am speaking with the correct person using two identifiers. Christy Howell is currently located at home and patient are currently with her during visit. The provider, Fransisca Kaufmann Jamorian Dimaria, MD is located in their office at time of visit.  Call ended at 1656  I discussed the limitations, risks, security and privacy concerns of performing an evaluation and management service by telephone and the availability of in person appointments. I also discussed with the patient that there may be a patient responsible charge related to this service. The patient expressed understanding and agreed to proceed.   History and Present Illness: Patient is calling in for sinus pressure and congestion.  She finished prednisone and zpack and she is still having sinus pressure and headache and is finishing prednisone 4 days ago.  She denies fevers or chills. She started feeling bad 11/25. 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 She denies SOB or wheezing.  She just feels like it improved some but now it is worsened again now that she stopped the prednisone and she is on her last an antibiotic and is just not clearing it.  1. Acute non-recurrent maxillary sinusitis     Outpatient Encounter Medications as of 02/09/2021  Medication Sig   cefdinir (OMNICEF) 300 MG capsule Take 1 capsule (300 mg total) by mouth 2 (two) times daily. 1 po BID   azithromycin (ZITHROMAX Z-PAK) 250 MG tablet As directed   buPROPion (WELLBUTRIN XL) 150 MG 24 hr tablet Take 1 tablet (150 mg total) by mouth daily.   busPIRone (BUSPAR) 15 MG tablet Take 1 tablet (15 mg total) by mouth 2 (two) times daily.   hydrOXYzine (ATARAX/VISTARIL) 50 MG tablet Take 0.5-2 tablets (25-100 mg total) by mouth at bedtime as needed for anxiety (sleep).   levonorgestrel (MIRENA) 20 MCG/24HR IUD 20 mcg by Intrauterine route once.    omeprazole (PRILOSEC) 40 MG capsule Take 1 capsule (40 mg total) by mouth daily. (Patient not taking: Reported on 08/24/2020)   PARoxetine (PAXIL) 40 MG tablet Take 1 tablet (40 mg total) by mouth every morning.   No facility-administered encounter medications on file as of 02/09/2021.    Review of Systems  Constitutional:  Negative for chills and fever.  HENT:  Positive for congestion, postnasal drip, sinus pressure and sore throat. Negative for ear discharge, ear pain, rhinorrhea and sneezing.   Eyes:  Negative for pain, redness and visual disturbance.  Respiratory:  Positive for cough. Negative for chest tightness and shortness of breath.   Cardiovascular:  Negative for chest pain and leg swelling.  Genitourinary:  Negative for difficulty urinating and dysuria.  Musculoskeletal:  Negative for back pain and gait problem.  Skin:  Negative for rash.  Neurological:  Negative for light-headedness and headaches.  Psychiatric/Behavioral:  Negative for agitation and behavioral problems.   All other systems reviewed and are negative.  Observations/Objective: Patient sounds comfortable and in no acute distress  Assessment and Plan: Problem List Items Addressed This Visit   None Visit Diagnoses     Acute non-recurrent maxillary sinusitis    -  Primary   Relevant Medications   cefdinir (OMNICEF) 300 MG capsule       We will try Omnicef, it seems she did not clear with the azithromycin. Follow up plan: Return if symptoms worsen or fail to improve.     I  discussed the assessment and treatment plan with the patient. The patient was provided an opportunity to ask questions and all were answered. The patient agreed with the plan and demonstrated an understanding of the instructions.   The patient was advised to call back or seek an in-person evaluation if the symptoms worsen or if the condition fails to improve as anticipated.  The above assessment and management plan was discussed with  the patient. The patient verbalized understanding of and has agreed to the management plan. Patient is aware to call the clinic if symptoms persist or worsen. Patient is aware when to return to the clinic for a follow-up visit. Patient educated on when it is appropriate to go to the emergency department.    I provided 7 minutes of non-face-to-face time during this encounter.    Nils Pyle, MD

## 2021-03-15 ENCOUNTER — Telehealth: Payer: Self-pay

## 2021-10-17 ENCOUNTER — Other Ambulatory Visit: Payer: Self-pay | Admitting: Family Medicine

## 2021-10-17 DIAGNOSIS — F410 Panic disorder [episodic paroxysmal anxiety] without agoraphobia: Secondary | ICD-10-CM

## 2021-10-17 DIAGNOSIS — F32A Depression, unspecified: Secondary | ICD-10-CM

## 2021-10-17 DIAGNOSIS — E669 Obesity, unspecified: Secondary | ICD-10-CM

## 2021-10-17 DIAGNOSIS — Z720 Tobacco use: Secondary | ICD-10-CM

## 2021-10-19 ENCOUNTER — Ambulatory Visit (INDEPENDENT_AMBULATORY_CARE_PROVIDER_SITE_OTHER): Payer: Self-pay | Admitting: Nurse Practitioner

## 2021-10-19 ENCOUNTER — Other Ambulatory Visit: Payer: Self-pay | Admitting: Nurse Practitioner

## 2021-10-19 ENCOUNTER — Encounter: Payer: Self-pay | Admitting: Nurse Practitioner

## 2021-10-19 VITALS — BP 132/78 | HR 87 | Temp 99.4°F | Resp 20 | Ht 60.0 in | Wt 165.0 lb

## 2021-10-19 DIAGNOSIS — J029 Acute pharyngitis, unspecified: Secondary | ICD-10-CM

## 2021-10-19 LAB — CULTURE, GROUP A STREP

## 2021-10-19 LAB — RAPID STREP SCREEN (MED CTR MEBANE ONLY): Strep Gp A Ag, IA W/Reflex: NEGATIVE

## 2021-10-19 NOTE — Patient Instructions (Signed)
Force fluids °Motrin or tylenol OTC °OTC decongestant °Throat lozenges if help °New toothbrush in 3 days ° °

## 2021-10-19 NOTE — Progress Notes (Signed)
   Subjective:    Patient ID: Christy Howell, female    DOB: [birth year], 46 y.o.   MRN: [id number]   Chief Complaint: sore throat  Sore Throat  This is a new problem. The current episode started in the past 7 days. The problem has been gradually worsening. Neither side of throat is experiencing more pain than the other. There has been no fever. The pain is at a severity of 4/10. The pain is mild. Associated symptoms include congestion, coughing, swollen glands and trouble swallowing. Pertinent negatives include no abdominal pain, headaches or shortness of breath. Treatments tried: dayquil. The treatment provided mild relief.       Review of Systems  Constitutional:  Negative for diaphoresis.  HENT:  Positive for congestion, sore throat, trouble swallowing and voice change.   Eyes:  Negative for pain.  Respiratory:  Positive for cough. Negative for shortness of breath.   Cardiovascular:  Negative for chest pain, palpitations and leg swelling.  Gastrointestinal:  Negative for abdominal pain.  Endocrine: Negative for polydipsia.  Skin:  Negative for rash.  Neurological:  Negative for dizziness, weakness and headaches.  Hematological:  Does not bruise/bleed easily.  All other systems reviewed and are negative.      Objective:   Physical Exam Vitals and nursing note reviewed.  Constitutional:      Appearance: Normal appearance.  HENT:     Right Ear: Tympanic membrane normal.     Left Ear: Tympanic membrane normal.     Nose: Congestion and rhinorrhea present.     Mouth/Throat:     Pharynx: Posterior oropharyngeal erythema (mild) present. No oropharyngeal exudate.     Comments: Voice raspy Cardiovascular:     Rate and Rhythm: Normal rate and regular rhythm.     Heart sounds: Normal heart sounds.  Pulmonary:     Effort: Pulmonary effort is normal.     Breath sounds: Normal breath sounds.  Musculoskeletal:     Cervical back: Normal range of motion and neck supple.  Skin:     General: Skin is warm.  Neurological:     General: No focal deficit present.     Mental Status: She is alert and oriented to person, place, and time.  Psychiatric:        Mood and Affect: Mood normal.        Behavior: Behavior normal.    BP 132/78   Pulse 87   Temp 99.4 F (37.4 C) (Oral)   Resp 20   Ht 5' (1.524 m)   Wt 165 lb (74.8 kg)   SpO2 98%   BMI 32.22 kg/m   Strep negative      Assessment & Plan:   Christy Howell in today with chief complaint of Sore Throat   1. Sore throat - Rapid Strep Screen ([facility] ONLY) - Culture, Group A Strep  2. Viral pharyngitis Force fluids Motrin or tylenol OTC OTC decongestant Throat lozenges if help New toothbrush in 3 days   Refused covid testing    The above assessment and management plan was discussed with the patient. The patient verbalized understanding of and has agreed to the management plan. Patient is aware to call the clinic if symptoms persist or worsen. Patient is aware when to return to the clinic for a follow-up visit. Patient educated on when it is appropriate to go to the emergency department.   Christy Daphine Deutscher, FNP

## 2021-10-20 ENCOUNTER — Telehealth: Payer: Self-pay | Admitting: Family Medicine

## 2021-10-20 MED ORDER — DOXYCYCLINE HYCLATE 100 MG PO TABS: 100.0000 mg | ORAL_TABLET | Freq: Two times a day (BID) | ORAL | 0 refills | Status: DC

## 2021-10-20 NOTE — Telephone Encounter (Signed)
Pt called stating that she had a visit with MMM yesterday and tested negative for strep so no medicine was sent in for her. Pt requesting that MMM send her in an antibiotic for sinus because she says she cant take feeling the way she feels. Says she is all stopped up and her nose bled all last night and has a terrible headache.  Please advise and call patient at [phone]

## 2021-10-20 NOTE — Telephone Encounter (Signed)
Antibiotic sent to pharmacy  Meds ordered this encounter  Medications   doxycycline (VIBRA-TABS) 100 MG tablet    Sig: Take 1 tablet (100 mg total) by mouth 2 (two) times daily. 1 po bid    Dispense:  20 tablet    Refill:  0    Order Specific Question:   Supervising Provider    Answer:   DETTINGER, JOSHUA A [[id number]]   Christy Alyzae Hawkey, FNP  

## 2021-10-22 LAB — CULTURE, GROUP A STREP: Strep A Culture: NEGATIVE

## 2021-11-21 ENCOUNTER — Telehealth: Payer: Self-pay | Admitting: Family Medicine

## 2021-11-21 ENCOUNTER — Other Ambulatory Visit: Payer: Self-pay | Admitting: Family Medicine

## 2021-11-21 DIAGNOSIS — F32A Depression, unspecified: Secondary | ICD-10-CM

## 2021-11-21 DIAGNOSIS — F410 Panic disorder [episodic paroxysmal anxiety] without agoraphobia: Secondary | ICD-10-CM

## 2021-11-21 DIAGNOSIS — E669 Obesity, unspecified: Secondary | ICD-10-CM

## 2021-11-21 DIAGNOSIS — Z720 Tobacco use: Secondary | ICD-10-CM

## 2021-11-21 MED ORDER — BUPROPION HCL ER (XL) 150 MG PO TB24: 150.0000 mg | ORAL_TABLET | Freq: Every day | ORAL | 3 refills | Status: DC

## 2021-11-21 MED ORDER — BUSPIRONE HCL 15 MG PO TABS: 15.0000 mg | ORAL_TABLET | Freq: Two times a day (BID) | ORAL | 3 refills | Status: DC

## 2021-11-21 MED ORDER — PAROXETINE HCL 40 MG PO TABS: 40.0000 mg | ORAL_TABLET | ORAL | 3 refills | Status: DC

## 2021-11-21 NOTE — Telephone Encounter (Signed)
Patient has appointment scheduled for 03-07-2022 and will need medication refilled for  buPROPion (WELLBUTRIN XL) 150 MG 24 hr tablet  busPIRone (BUSPAR) 15 MG tablet  PARoxetine (PAXIL) 40 MG tablet  until her appointment date.  Please advise. Please send to [facility] [address]

## 2021-11-21 NOTE — Telephone Encounter (Signed)
Meds sent

## 2021-12-13 ENCOUNTER — Encounter: Payer: Self-pay | Admitting: Family Medicine

## 2022-03-07 ENCOUNTER — Encounter: Payer: Self-pay | Admitting: Family Medicine

## 2022-03-09 ENCOUNTER — Encounter: Payer: Self-pay | Admitting: Family Medicine

## 2022-07-31 ENCOUNTER — Encounter: Payer: Self-pay | Admitting: Family Medicine

## 2022-07-31 NOTE — Progress Notes (Deleted)
Christy Howell is a 47 y.o. female presents to office today for annual physical exam examination.    Concerns today include: 1. ***  Occupation: ***, Marital status: ***, Substance use: *** Diet: ***, Exercise: *** Last eye exam: *** Last dental exam: *** Last colonoscopy: *** Last mammogram: *** Last pap smear: *** Refills needed today: *** Immunizations needed: Immunization History  Administered Date(s) Administered   PFIZER(Purple Top)SARS-COV-2 Vaccination 11/19/2019, 12/10/2019   Tdap 07/24/2017     Past Medical History:  Diagnosis Date   Anxiety    Arthritis    Carpal tunnel syndrome on both sides    GERD (gastroesophageal reflux disease)    Social History   Socioeconomic History   Marital status: Married    Spouse name: Not on file   Number of children: 3   Years of education: Not on file   Highest education level: Not on file  Occupational History   Occupation: runs a restarant  Tobacco Use   Smoking status: Every Day    Packs/day: 1.00    Years: 25.00    Additional pack years: 0.00    Total pack years: 25.00    Types: Cigarettes   Smokeless tobacco: Never   Tobacco comments:    tobacco info given  Vaping Use   Vaping Use: Never used  Substance and Sexual Activity   Alcohol use: No   Drug use: No   Sexual activity: Yes    Birth control/protection: Surgical, I.U.D.    Comment: IUD due for removal 2020  Other Topics Concern   Not on file  Social History Narrative   Married, 3 children   Right handed   12 th grade   4-5 16 oz diet sodas daily   Social Determinants of Health   Financial Resource Strain: Not on file  Food Insecurity: Not on file  Transportation Needs: Not on file  Physical Activity: Not on file  Stress: Not on file  Social Connections: Not on file  Intimate Partner Violence: Not on file   Past Surgical History:  Procedure Laterality Date   CARPAL TUNNEL RELEASE Right 09/25/2017   Procedure: RIGHT CARPAL TUNNEL  RELEASE, LEFT CARPAL TUNNEL INJECTION;  Surgeon: Tarry Kos, MD;  Location: [facility];  Service: Orthopedics;  Laterality: Right;   CESAREAN SECTION     x 1   MOUTH SURGERY     wisdom teeth   STERIOD INJECTION Left 09/25/2017   Procedure: STEROID INJECTION;  Surgeon: Tarry Kos, MD;  Location: [facility];  Service: Orthopedics;  Laterality: Left;   TUBAL LIGATION     Family History  Problem Relation Age of Onset   Rectal cancer Maternal Grandmother    Colon cancer Neg Hx    Esophageal cancer Neg Hx     Current Outpatient Medications:    buPROPion (WELLBUTRIN XL) 150 MG 24 hr tablet, Take 1 tablet (150 mg total) by mouth daily., Disp: 90 tablet, Rfl: 3   busPIRone (BUSPAR) 15 MG tablet, Take 1 tablet (15 mg total) by mouth 2 (two) times daily., Disp: 180 tablet, Rfl: 3   doxycycline (VIBRA-TABS) 100 MG tablet, Take 1 tablet (100 mg total) by mouth 2 (two) times daily. 1 po bid, Disp: 20 tablet, Rfl: 0   hydrOXYzine (ATARAX/VISTARIL) 50 MG tablet, Take 0.5-2 tablets (25-100 mg total) by mouth at bedtime as needed for anxiety (sleep). (Patient not taking: Reported on 10/19/2021), Disp: 180 tablet, Rfl: 3   levonorgestrel (MIRENA)  20 MCG/24HR IUD, 20 mcg by Intrauterine route once., Disp: , Rfl:    omeprazole (PRILOSEC) 40 MG capsule, Take 1 capsule (40 mg total) by mouth daily. (Patient not taking: Reported on 08/24/2020), Disp: 30 capsule, Rfl: 1   PARoxetine (PAXIL) 40 MG tablet, Take 1 tablet (40 mg total) by mouth every morning., Disp: 90 tablet, Rfl: 3  Allergies  Allergen Reactions   Kiwi Extract     Throat itches and hives   Strawberry Extract Hives    blistering   Penicillins Rash     ROS: Review of Systems {ros; complete:30496}    Physical exam {Exam, [phone]}    Assessment/ Plan: Christy Howell here for annual physical exam.   Annual physical exam  Screening for malignant neoplasm of cervix  Obesity (BMI  30-39.9)  Tobacco use  Generalized anxiety disorder with panic attacks  Depressive disorder  ***  Counseled on healthy lifestyle choices, including diet (rich in fruits, vegetables and lean meats and low in salt and simple carbohydrates) and exercise (at least 30 minutes of moderate physical activity daily).  Patient to follow up in 1 year for annual exam or sooner if needed.  Seferina Brokaw M. Nadine Counts, DO

## 2022-08-01 ENCOUNTER — Encounter: Payer: Self-pay | Admitting: Family Medicine

## 2023-03-24 ENCOUNTER — Encounter: Payer: Self-pay | Admitting: Family Medicine

## 2023-05-14 ENCOUNTER — Other Ambulatory Visit: Payer: Self-pay | Admitting: Family Medicine

## 2023-05-14 DIAGNOSIS — F410 Panic disorder [episodic paroxysmal anxiety] without agoraphobia: Secondary | ICD-10-CM

## 2023-05-14 MED ORDER — PAROXETINE HCL 40 MG PO TABS
40.0000 mg | ORAL_TABLET | ORAL | 0 refills | Status: DC
Start: 2023-05-14 — End: 2023-05-15

## 2023-05-14 NOTE — Telephone Encounter (Signed)
LMOVM refill sent to pharmacy 

## 2023-05-14 NOTE — Telephone Encounter (Signed)
 Copied from CRM [id number]. Topic: Clinical - Medication Refill >> May 14, 2023 10:16 AM Geroge Baseman wrote: Most Recent Primary Care Visit:   Medication: PARoxetine (PAXIL) 40 MG tablet  Has the patient contacted their pharmacy? Yes No refills  Is this the correct pharmacy for this prescription? Yes If no, delete pharmacy and type the correct one.  This is the patient's preferred pharmacy:  [facility] [address] [place] - [address] [place] [id number] [place] [address] Phone: [phone] Fax: [phone]   Has the prescription been filled recently? No  Is the patient out of the medication? Yes  Has the patient been seen for an appointment in the last year OR does the patient have an upcoming appointment? Yes  Can we respond through MyChart? No  Agent: Please be advised that Rx refills may take up to 3 business days. We ask that you follow-up with your pharmacy.

## 2023-05-15 ENCOUNTER — Encounter: Payer: Self-pay | Admitting: Family Medicine

## 2023-05-15 DIAGNOSIS — E669 Obesity, unspecified: Secondary | ICD-10-CM

## 2023-05-15 DIAGNOSIS — Z720 Tobacco use: Secondary | ICD-10-CM

## 2023-05-15 DIAGNOSIS — F5104 Psychophysiologic insomnia: Secondary | ICD-10-CM

## 2023-05-15 DIAGNOSIS — F32A Depression, unspecified: Secondary | ICD-10-CM

## 2023-05-15 DIAGNOSIS — F410 Panic disorder [episodic paroxysmal anxiety] without agoraphobia: Secondary | ICD-10-CM

## 2023-05-15 MED ORDER — BUPROPION HCL ER (XL) 150 MG PO TB24
150.0000 mg | ORAL_TABLET | Freq: Every day | ORAL | 3 refills | Status: AC
Start: 2023-05-15 — End: ?

## 2023-05-15 MED ORDER — BUSPIRONE HCL 15 MG PO TABS
15.0000 mg | ORAL_TABLET | Freq: Two times a day (BID) | ORAL | 3 refills | Status: AC
Start: 2023-05-15 — End: ?

## 2023-05-15 MED ORDER — PAROXETINE HCL 40 MG PO TABS
40.0000 mg | ORAL_TABLET | ORAL | 4 refills | Status: AC
Start: 2023-05-15 — End: ?

## 2023-05-15 MED ORDER — HYDROXYZINE HCL 50 MG PO TABS
25.0000 mg | ORAL_TABLET | Freq: Every evening | ORAL | 3 refills | Status: AC | PRN
Start: 2023-05-15 — End: ?

## 2023-05-15 NOTE — Telephone Encounter (Signed)
Please see the MyChart message reply(ies) for my assessment and plan.    This patient gave consent for this Medical Advice Message and is aware that it may result in a bill to [employer], as well as the possibility of receiving a bill for a co-payment or deductible. They are an established patient, but are not seeking medical advice exclusively about a problem treated during an in person or video visit in the last seven days. I did not recommend an in person or video visit within seven days of my reply.    I spent a total of 6 minutes cumulative time within 7 days through [employer].  Delynn Flavin, DO

## 2023-06-13 ENCOUNTER — Ambulatory Visit: Payer: Self-pay | Admitting: Family Medicine

## 2023-06-13 ENCOUNTER — Encounter: Payer: Self-pay | Admitting: Family Medicine

## 2023-06-17 ENCOUNTER — Encounter: Payer: Self-pay | Admitting: Family Medicine

## 2023-06-17 ENCOUNTER — Ambulatory Visit: Payer: Self-pay | Admitting: Family Medicine

## 2023-06-17 VITALS — BP 122/83 | HR 104 | Ht 60.0 in | Wt 182.0 lb

## 2023-06-17 DIAGNOSIS — F32A Depression, unspecified: Secondary | ICD-10-CM

## 2023-06-17 DIAGNOSIS — F411 Generalized anxiety disorder: Secondary | ICD-10-CM

## 2023-06-17 DIAGNOSIS — Z1211 Encounter for screening for malignant neoplasm of colon: Secondary | ICD-10-CM

## 2023-06-17 DIAGNOSIS — F410 Panic disorder [episodic paroxysmal anxiety] without agoraphobia: Secondary | ICD-10-CM

## 2023-06-17 NOTE — Patient Instructions (Signed)
 [facility] (may be cheaper than your current pharmacy) Message me with whatever email you sign up with and which meds (if any) you want me to refill to them instead.
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
## 2023-06-18 ENCOUNTER — Encounter: Payer: Self-pay | Admitting: Family Medicine

## 2023-06-18 LAB — CMP14+EGFR
ALT: 28 IU/L (ref 0–32)
AST: 20 IU/L (ref 0–40)
Albumin: 4.5 g/dL (ref 3.9–4.9)
Alkaline Phosphatase: 65 IU/L (ref 44–121)
BUN/Creatinine Ratio: 12 (ref 9–23)
BUN: 10 mg/dL (ref 6–24)
Bilirubin Total: 0.2 mg/dL (ref 0.0–1.2)
CO2: 21 mmol/L (ref 20–29)
Calcium: 9.5 mg/dL (ref 8.7–10.2)
Chloride: 101 mmol/L (ref 96–106)
Creatinine, Ser: 0.82 mg/dL (ref 0.57–1.00)
Globulin, Total: 2.7 g/dL (ref 1.5–4.5)
Glucose: 99 mg/dL (ref 70–99)
Potassium: 4.1 mmol/L (ref 3.5–5.2)
Sodium: 139 mmol/L (ref 134–144)
Total Protein: 7.2 g/dL (ref 6.0–8.5)
eGFR: 90 mL/min/{1.73_m2} (ref >59)

## 2023-06-20 ENCOUNTER — Encounter: Payer: Self-pay | Admitting: *Deleted

## 2023-12-24 ENCOUNTER — Encounter (INDEPENDENT_AMBULATORY_CARE_PROVIDER_SITE_OTHER): Payer: Self-pay | Admitting: *Deleted

## 2024-01-10 ENCOUNTER — Encounter: Payer: Self-pay | Admitting: Family Medicine

## 2024-01-10 DIAGNOSIS — Z9151 Personal history of suicidal behavior: Secondary | ICD-10-CM | POA: Insufficient documentation

## 2024-01-17 ENCOUNTER — Inpatient Hospital Stay: Payer: Self-pay | Admitting: Family Medicine

## 2024-01-21 ENCOUNTER — Encounter: Payer: Self-pay | Admitting: Family Medicine

## 2024-02-21 ENCOUNTER — Ambulatory Visit: Payer: Self-pay | Admitting: Family Medicine

## 2024-02-21 ENCOUNTER — Telehealth: Payer: Self-pay
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
## 2024-02-21 NOTE — Telephone Encounter (Signed)
 Reached out to make sure patient was aware that she missed her appointment today, patient stated that she had just gotten off the phone with the front office and had rescheduled her appointment for Monday 02/24/2024 at 3:05pm. Patient stated that she would come on in now if Dr. Jolinda could squeeze her in even though she is late, I advised patient that we would not have time to get her in today but we would see her Monday. Patient verbalized understanding.

## 2024-02-24 ENCOUNTER — Encounter: Payer: Self-pay | Admitting: Family Medicine

## 2024-02-24 ENCOUNTER — Ambulatory Visit: Payer: Self-pay

## 2024-02-24 VITALS — BP 111/74 | HR 75 | Temp 98.3°F | Ht 60.0 in | Wt 171.5 lb

## 2024-02-24 DIAGNOSIS — G43009 Migraine without aura, not intractable, without status migrainosus: Secondary | ICD-10-CM

## 2024-02-24 DIAGNOSIS — Z9151 Personal history of suicidal behavior: Secondary | ICD-10-CM

## 2024-02-24 DIAGNOSIS — Z87898 Personal history of other specified conditions: Secondary | ICD-10-CM

## 2024-02-24 DIAGNOSIS — F411 Generalized anxiety disorder: Secondary | ICD-10-CM

## 2024-02-24 DIAGNOSIS — F410 Panic disorder [episodic paroxysmal anxiety] without agoraphobia: Secondary | ICD-10-CM

## 2024-02-24 DIAGNOSIS — F32A Depression, unspecified: Secondary | ICD-10-CM

## 2024-02-24 MED ORDER — RIZATRIPTAN BENZOATE 10 MG PO TBDP: 10.0000 mg | ORAL_TABLET | ORAL | 5 refills | Status: AC | PRN

## 2024-02-24 MED ORDER — PROPRANOLOL HCL 20 MG PO TABS: 20.0000 mg | ORAL_TABLET | Freq: Two times a day (BID) | ORAL | 1 refills | Status: AC

## 2024-02-24 NOTE — Progress Notes (Signed)
 "  Subjective: Christy Howell follow up PCP: Christy Howell Howell, Christy Howell Howell YEP:[name] is a 48 y.o. female presenting to clinic today for:  Here for hospital follow up on stay in October 2025 after she attempted overdose.  Her UDS was positive for cocaine and THC.  Per the chart, she admitted to 1 pint of ETOH daily, tobacco and regular adderall use.  She reports that she is not utilizing any illicit substances or alcohol to the degree that she was drinking before.  She does drink an occasional glass of wine but notes that the last time she did that she felt really bad so has not done so since.  She has not yet seen her psychiatrist and was supposed to see them today but slept through the appointment so we will need to be rescheduled.  Anticipates seeing Christy Howell Howell at [facility].  She is currently doing group therapy sessions 2 days/week though she does not find them helpful.  She continues to have anxiety and sleep difficulties.  She notes that she was placed on Maxalt  3 times daily as needed for migraine headaches that were not relieved by oral NSAIDs and analgesics.  She takes about 800 mg of ibuprofen  daily to help with headaches and then has needed Maxalt  only twice since she was discharged from the hospital.  She reports migraines typically include nausea, light sensitivity and phonophobia.    She was prescribed temazepam for sleep and was not sure who was supposed to refill that.  Her Wellbutrin  was increased to 300 mg, BuSpar  reduced to 10 mg and Paxil  reduced to 20 mg.  Continues to have struggles with anxiety and depression.  Her husband is present for today's visit and notes that they have really tried to offload some of the stressors that were on Christy Howell Howell including having some family help out with the children that moved in more often.   ROS: Per HPI  Allergies[1] Past Medical History:  Diagnosis Date   Anxiety    Arthritis    Carpal tunnel syndrome on both sides    GERD (gastroesophageal  reflux disease)    Current Medications[2] Social History   Socioeconomic History   Marital status: Married    Spouse name: Not on file   Number of children: 3   Years of education: Not on file   Highest education level: Not on file  Occupational History   Occupation: runs a restarant  Tobacco Use   Smoking status: Every Day    Current packs/day: 1.00    Average packs/day: 1 pack/day for 25.0 years (25.0 ttl pk-yrs)    Types: Cigarettes   Smokeless tobacco: Never   Tobacco comments:    tobacco info given  Vaping Use   Vaping status: Never Used  Substance and Sexual Activity   Alcohol use: No   Drug use: No   Sexual activity: Yes    Birth control/protection: Surgical, I.U.D.    Comment: IUD due for removal 2020  Other Topics Concern   Not on file  Social History Narrative   Married, 3 children   Right handed   12 th grade   4-5 16 oz diet sodas daily   Social Drivers of Health   Tobacco Use: High Risk (06/17/2023)   Patient History    Smoking Tobacco Use: Every Day    Smokeless Tobacco Use: Never    Passive Exposure: Not on file  Financial Resource Strain: Not on file  Food Insecurity: Not on file  Transportation  Needs: Not on file  Physical Activity: Not on file  Stress: Not on file  Social Connections: Not on file  Intimate Partner Violence: Not on file  Depression (PHQ2-9): High Risk (06/17/2023)   Depression (PHQ2-9)    PHQ-2 Score: 13  Alcohol Screen: Not on file  Housing: Not on file  Utilities: Not on file  Health Literacy: Not on file   Family History  Problem Relation Age of Onset   Rectal cancer Maternal Grandmother    Colon cancer Neg Hx    Esophageal cancer Neg Hx     Objective: Office vital signs reviewed. BP 111/74   Pulse 75   Temp 98.3 F (36.8 C)   Ht 5' (1.524 m)   Wt 171 lb 8 oz (77.8 kg)   SpO2 96%   BMI 33.49 kg/m   Physical Examination:  General: Awake, alert, well nourished, No acute distress Psych: Intermittently  tearful.  Very pleasant, interactive.  Does not appear to be responding to internal stimuli Neuro: No focal neurologic deficits identified     02/24/2024    3:37 PM 06/17/2023    4:13 PM 05/11/2019    4:59 PM  Depression screen PHQ 2/9  Decreased Interest 2 1 3   Down, Depressed, Hopeless 2 2 2   PHQ - 2 Score 4 3 5   Altered sleeping 3 2 3   Tired, decreased energy 3 3 3   Change in appetite 2 1 1   Feeling bad or failure about yourself  3 1 1   Trouble concentrating 3 2 2   Moving slowly or fidgety/restless 2 1 2   Suicidal thoughts 1 0 0  PHQ-9 Score 21 13  17    Difficult doing work/chores Somewhat difficult Somewhat difficult      Data saved with a previous flowsheet row definition      02/24/2024    3:37 PM 06/17/2023    4:13 PM 05/11/2019    4:59 PM 09/13/2017   10:23 AM  GAD 7 : Generalized Anxiety Score  Nervous, Anxious, on Edge 3 1 2 3   Control/stop worrying 3 2 2 3   Worry too much - different things 3 1 2 2   Trouble relaxing 2 1 2 2   Restless 2 1 3 2   Easily annoyed or irritable 3 1 3 2   Afraid - awful might happen 2 1 0   Total GAD 7 Score 18 8 14    Anxiety Difficulty Very difficult Somewhat difficult  Somewhat difficult    Assessment/ Plan: 48 y.o. female   Depressive disorder  Generalized anxiety disorder with panic attacks - Plan: propranolol  (INDERAL ) 20 MG tablet  History of suicide attempt  History of alcohol use disorder  History of substance use  Migraine without aura and without status migrainosus, not intractable - Plan: propranolol  (INDERAL ) 20 MG tablet, rizatriptan  (MAXALT -MLT) 10 MG disintegrating tablet   Encouraged her to reschedule with her psychiatrist for maintenance of medications.  I am going to add propranolol  to help with migraine headaches but hopefully she will see improvement with anxiety disorder as well.  Maxalt  given for as needed use.  Would like to see her back in about 6 to 8 weeks to follow-up on how she is doing from mental health  standpoint.  Discussed caution of multiple benzodiazepines, especially with any alcohol use as this could risk severe side effects and/or death.  Both she and her husband voiced good understanding  I wonder if she might not benefit from a mood stabilizer like Seroquel etc.  She reports  to me some symptoms concerning for possible hypomania.  Total time spent with patient 29 minutes.  Greater than 50% of encounter spent in coordination of care/counseling.   Norene CHRISTELLA Fielding, Christy Howell Howell [facility] [phone]     [1]  Allergies Allergen Reactions   Kiwi Extract     Throat itches and hives   Strawberry Extract Hives    blistering   Penicillins Rash  [2]  Current Outpatient Medications:    buPROPion  (WELLBUTRIN  XL) 150 MG 24 hr tablet, Take 1 tablet (150 mg total) by mouth daily., Disp: 90 tablet, Rfl: 3   busPIRone  (BUSPAR ) 15 MG tablet, Take 1 tablet (15 mg total) by mouth 2 (two) times daily., Disp: 180 tablet, Rfl: 3   hydrOXYzine  (ATARAX ) 50 MG tablet, Take 0.5-2 tablets (25-100 mg total) by mouth at bedtime as needed for anxiety (sleep)., Disp: 180 tablet, Rfl: 3   levonorgestrel (MIRENA) 20 MCG/24HR IUD, 20 mcg by Intrauterine route once., Disp: , Rfl:    PARoxetine  (PAXIL ) 40 MG tablet, Take 1 tablet (40 mg total) by mouth every morning., Disp: 90 tablet, Rfl: 4  "

## 2024-03-09 ENCOUNTER — Telehealth: Payer: Self-pay | Admitting: *Deleted

## 2024-03-09 NOTE — Telephone Encounter (Signed)
-----   Message from Zeb, [place] sent at 02/24/2024  5:28 PM EST ----- Please fax today's visit to Elsie Merritt at [facility]

## 2024-03-09 NOTE — Telephone Encounter (Signed)
 Clinic notes printed and faxed to Elsie Merritt, MD [facility]

## 2024-04-07 ENCOUNTER — Ambulatory Visit: Payer: Self-pay | Admitting: Family Medicine
